# Patient Record
Sex: Female | Born: 1995
Health system: Southern US, Community
[De-identification: ages and names within clinical notes are randomized; demographics above are authoritative.]

## PROBLEM LIST (undated history)

## (undated) DIAGNOSIS — O10013 Pre-existing essential hypertension complicating pregnancy, third trimester: Secondary | ICD-10-CM

## (undated) HISTORY — PX: BACK SURGERY: SHX140

---
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
## 2016-02-23 DIAGNOSIS — Z111 Encounter for screening for respiratory tuberculosis: Secondary | ICD-10-CM | POA: Diagnosis not present

## 2016-04-21 DIAGNOSIS — Z23 Encounter for immunization: Secondary | ICD-10-CM | POA: Diagnosis not present

## 2016-04-21 DIAGNOSIS — R002 Palpitations: Secondary | ICD-10-CM | POA: Diagnosis not present

## 2016-04-21 DIAGNOSIS — Z6839 Body mass index (BMI) 39.0-39.9, adult: Secondary | ICD-10-CM | POA: Diagnosis not present

## 2016-05-02 DIAGNOSIS — R002 Palpitations: Secondary | ICD-10-CM | POA: Diagnosis not present

## 2016-06-21 DIAGNOSIS — Z111 Encounter for screening for respiratory tuberculosis: Secondary | ICD-10-CM | POA: Diagnosis not present

## 2016-08-11 DIAGNOSIS — Z3202 Encounter for pregnancy test, result negative: Secondary | ICD-10-CM | POA: Diagnosis not present

## 2016-08-11 DIAGNOSIS — Z30012 Encounter for prescription of emergency contraception: Secondary | ICD-10-CM | POA: Diagnosis not present

## 2016-08-24 DIAGNOSIS — Z3009 Encounter for other general counseling and advice on contraception: Secondary | ICD-10-CM | POA: Diagnosis not present

## 2016-09-28 DIAGNOSIS — D171 Benign lipomatous neoplasm of skin and subcutaneous tissue of trunk: Secondary | ICD-10-CM | POA: Diagnosis not present

## 2016-09-28 DIAGNOSIS — Z6841 Body Mass Index (BMI) 40.0 and over, adult: Secondary | ICD-10-CM | POA: Diagnosis not present

## 2016-10-11 DIAGNOSIS — R222 Localized swelling, mass and lump, trunk: Secondary | ICD-10-CM | POA: Diagnosis not present

## 2016-10-14 DIAGNOSIS — R222 Localized swelling, mass and lump, trunk: Secondary | ICD-10-CM | POA: Diagnosis not present

## 2016-10-14 DIAGNOSIS — F17210 Nicotine dependence, cigarettes, uncomplicated: Secondary | ICD-10-CM | POA: Diagnosis not present

## 2016-10-14 DIAGNOSIS — Z882 Allergy status to sulfonamides status: Secondary | ICD-10-CM | POA: Diagnosis not present

## 2016-10-14 DIAGNOSIS — D171 Benign lipomatous neoplasm of skin and subcutaneous tissue of trunk: Secondary | ICD-10-CM | POA: Diagnosis not present

## 2016-11-10 DIAGNOSIS — Z720 Tobacco use: Secondary | ICD-10-CM | POA: Diagnosis not present

## 2016-11-10 DIAGNOSIS — J029 Acute pharyngitis, unspecified: Secondary | ICD-10-CM | POA: Diagnosis not present

## 2016-11-10 DIAGNOSIS — Z6839 Body mass index (BMI) 39.0-39.9, adult: Secondary | ICD-10-CM | POA: Diagnosis not present

## 2016-11-10 MED FILL — AMOXICILLIN 875 MG TABLET: 875 | 10 days supply | Qty: 20 | Fill #0

## 2017-01-23 DIAGNOSIS — Z029 Encounter for administrative examinations, unspecified: Secondary | ICD-10-CM | POA: Diagnosis not present

## 2017-01-23 DIAGNOSIS — Z23 Encounter for immunization: Secondary | ICD-10-CM | POA: Diagnosis not present

## 2017-01-23 DIAGNOSIS — Z1159 Encounter for screening for other viral diseases: Secondary | ICD-10-CM | POA: Diagnosis not present

## 2017-01-26 DIAGNOSIS — Z23 Encounter for immunization: Secondary | ICD-10-CM | POA: Diagnosis not present

## 2017-04-10 DIAGNOSIS — Z3202 Encounter for pregnancy test, result negative: Secondary | ICD-10-CM | POA: Diagnosis not present

## 2017-04-12 DIAGNOSIS — R030 Elevated blood-pressure reading, without diagnosis of hypertension: Secondary | ICD-10-CM | POA: Diagnosis not present

## 2017-04-12 DIAGNOSIS — Z3201 Encounter for pregnancy test, result positive: Secondary | ICD-10-CM | POA: Diagnosis not present

## 2017-04-12 DIAGNOSIS — Z6838 Body mass index (BMI) 38.0-38.9, adult: Secondary | ICD-10-CM | POA: Diagnosis not present

## 2017-04-12 DIAGNOSIS — R002 Palpitations: Secondary | ICD-10-CM | POA: Diagnosis not present

## 2017-09-25 ENCOUNTER — Encounter: Payer: Self-pay | Admitting: Family Medicine

## 2017-09-25 ENCOUNTER — Ambulatory Visit (INDEPENDENT_AMBULATORY_CARE_PROVIDER_SITE_OTHER): Payer: No Typology Code available for payment source | Admitting: Family Medicine

## 2017-09-25 VITALS — BP 126/80 | HR 95 | Ht 65.0 in | Wt 223.1 lb

## 2017-09-25 DIAGNOSIS — Z6837 Body mass index (BMI) 37.0-37.9, adult: Secondary | ICD-10-CM | POA: Diagnosis not present

## 2017-09-25 DIAGNOSIS — F419 Anxiety disorder, unspecified: Secondary | ICD-10-CM | POA: Diagnosis not present

## 2017-09-25 DIAGNOSIS — E6609 Other obesity due to excess calories: Secondary | ICD-10-CM

## 2017-09-25 DIAGNOSIS — Z0000 Encounter for general adult medical examination without abnormal findings: Secondary | ICD-10-CM

## 2017-09-25 DIAGNOSIS — Z720 Tobacco use: Secondary | ICD-10-CM | POA: Diagnosis not present

## 2017-09-25 DIAGNOSIS — Z0001 Encounter for general adult medical examination with abnormal findings: Secondary | ICD-10-CM | POA: Diagnosis not present

## 2017-09-25 MED ORDER — MELATONIN 5 MG PO TABS: ORAL_TABLET | ORAL | 0 refills | Status: DC

## 2017-09-25 MED ORDER — FLUOXETINE HCL 10 MG PO TABS: 10.0000 mg | ORAL_TABLET | Freq: Every day | ORAL | 1 refills | Status: DC

## 2017-09-25 MED FILL — FLUoxetine HCL 10 MG TABS: 10 | 30 days supply | Qty: 30 | Fill #0

## 2017-09-25 NOTE — Progress Notes (Signed)
Subjective:  Patient ID: Lisa Sandoval, female    DOB: [birth year]  Age: 22 y.o. MRN: [id number]  CC: Establish Care   HPI [name] presents for a physical exam and to discuss her issues with anxiety and tobacco use.  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  She tells me that it is never been treated.  She is a chronic worrier and does not feel as though she is depressed.  She tells me that she is actually had to pull her car over before because of her anxiety.  She does have some trouble falling asleep at night.  She tells me that her anxiety has never been treated.  She has been smoking over the last 6 months and wants to quit.  She has tried the gum before but it was not helpful.  She is nonfasting this morning.  Her parents are in their mid 20s and both of them have diabetes.  Her mother is otherwise healthy as far as she knows.  Her father is another Research officer, trade [employer].  He developed heart disease in his early 51s and is status post 3 MIs.  He also was a smoker.  Patient lives alone.  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  She also works as a Public relations account executive.  She has engaged no other exercise but what she gets at work.  She has had a Pap smear at her GYN care is through her GYN physician.  History Skyla has no past medical history on file.   She has no past surgical history on file.   Her family history is not on file.She reports that she has never smoked. She has never used smokeless tobacco. She reports that she does not drink alcohol or use drugs.  Outpatient Medications Prior to Visit  Medication Sig Dispense Refill  . Ascorbic Acid (VITAMIN C PO) Take 1 tablet by mouth daily.    Marland Kitchen bismuth subsalicylate (PEPTO BISMOL) 262 MG/15ML suspension Take 30 mLs by mouth every 6 (six) hours as needed for indigestion.    Marland Kitchen ibuprofen (ADVIL,MOTRIN) 200 MG tablet Take 800 mg by mouth every 6 (six) hours as needed for headache.    . medroxyPROGESTERone (DEPO-PROVERA) 150 MG/ML  injection Inject 150 mg into the muscle every 3 (three) months.    . pseudoephedrine (SUDAFED) 60 MG tablet Take 60 mg by mouth every 8 (eight) hours as needed for congestion.     No facility-administered medications prior to visit.     ROS Review of Systems  Constitutional: Negative.   HENT: Negative.   Eyes: Negative.   Respiratory: Negative.   Cardiovascular: Negative.   Gastrointestinal: Negative.   Endocrine: Negative for polyphagia and polyuria.  Genitourinary: Negative.   Musculoskeletal: Negative.   Skin: Negative.   Allergic/Immunologic: Negative for immunocompromised state.  Neurological: Negative for weakness and light-headedness.  Hematological: Does not bruise/bleed easily.  Psychiatric/Behavioral: Positive for sleep disturbance. Negative for dysphoric mood, self-injury and suicidal ideas. The patient is nervous/anxious.     Objective:  BP 126/80 (BP Location: Right Arm, Patient Position: Sitting, Cuff Size: Normal)   Pulse 95   Ht 5\' 5"  (1.651 m)   Wt 223 lb 2 oz (101.2 kg)   SpO2 99%   BMI 37.13 kg/m   Physical Exam  Constitutional: She appears well-developed and well-nourished. No distress.  HENT:  Head: Normocephalic and atraumatic.  Right Ear: External ear normal.  Left Ear: External ear normal.  Nose: Nose  normal.  Mouth/Throat: Oropharynx is clear and moist.  Eyes: Pupils are equal, round, and reactive to light. Conjunctivae and EOM are normal. Right eye exhibits no discharge. Left eye exhibits no discharge. No scleral icterus.  Neck: Normal range of motion. Neck supple. No JVD present. No tracheal deviation present. No thyromegaly present.  Cardiovascular: Normal rate, regular rhythm and normal heart sounds.  Pulmonary/Chest: Effort normal and breath sounds normal.  Abdominal: Bowel sounds are normal.  Musculoskeletal: She exhibits no edema, tenderness or deformity.  Lymphadenopathy:    She has no cervical adenopathy.  Skin: Skin is warm and dry.  She is not diaphoretic.  Psychiatric: She has a normal mood and affect. Her behavior is normal.      Assessment & Plan:   Saharra was seen today for establish care.  Diagnoses and all orders for this visit:  Anxiety -     TSH; Future -     FLUoxetine (PROZAC) 10 MG tablet; Take 1 tablet (10 mg total) by mouth daily. -     Melatonin 5 MG TABS; Take one nightly before bed.  Tobacco abuse  Class 2 obesity due to excess calories without serious comorbidity with body mass index (BMI) of 37.0 to 37.9 in adult -     TSH; Future  Health care maintenance  Encounter for health maintenance examination with abnormal findings -     CBC; Future -     Comprehensive metabolic panel; Future -     HIV antibody; Future -     Lipid panel; Future -     TSH; Future -     Urinalysis, Routine w reflex microscopic; Future   I have discontinued Lilia Pro B. Venn's medroxyPROGESTERone, ibuprofen, pseudoephedrine, Ascorbic Acid (VITAMIN C PO), and bismuth subsalicylate. I am also having her start on FLUoxetine and Melatonin.  Meds ordered this encounter  Medications  . FLUoxetine (PROZAC) 10 MG tablet    Sig: Take 1 tablet (10 mg total) by mouth daily.    Dispense:  30 tablet    Refill:  1  . Melatonin 5 MG TABS    Sig: Take one nightly before bed.    Dispense:  90 tablet    Refill:  0   Anticipatory guidance was given the patient about smoking and its effect on her health and strategies to stop smoking as well.  Information was given to her about obesity and starting an exercise program to help her lose weight.  Suggested that the low-dose Prozac will also help her lose weight gain energy for her negative exercise program.  Stressed the importance of fasting blood work for her with regards to her father's health.  Recommended follow-up for her is in 1 month.  Follow-up: Return in about 1 month (around 10/25/2017).  Libby Maw, MD

## 2017-09-25 NOTE — Patient Instructions (Signed)
BMI for Adults Body mass index (BMI) is a number that is calculated from a person's weight and height. In most adults, the number is used to find how much of an adult's weight is made up of fat. BMI is not as accurate as a direct measure of body fat. How is BMI calculated? BMI is calculated by dividing weight in kilograms by height in meters squared. It can also be calculated by dividing weight in pounds by height in inches squared, then multiplying the resulting number by 703. Charts are available to help you find your BMI quickly and easily without doing this calculation. How is BMI interpreted? Health care professionals use BMI charts to identify whether an adult is underweight, at a normal weight, or overweight based on the following guidelines:  Underweight: BMI less than 18.5.  Normal weight: BMI between 18.5 and 24.9.  Overweight: BMI between 25 and 29.9.  Obese: BMI of 30 and above.  BMI is usually interpreted the same for males and females. Weight includes both fat and muscle, so someone with a muscular build, such as an athlete, may have a BMI that is higher than 24.9. In cases like these, BMI may not accurately depict body fat. To determine if excess body fat is the cause of a BMI of 25 or higher, further assessments may need to be done by a health care provider. Why is BMI a useful tool? BMI is used to identify a possible weight problem that may be related to a medical problem or may increase the risk for medical problems. BMI can also be used to promote changes to reach a healthy weight. This information is not intended to replace advice given to you by your health care provider. Make sure you discuss any questions you have with your health care provider. Document Released: 02/02/2004 Document Revised: 10/01/2015 Document Reviewed: 10/18/2013 Elsevier Interactive Patient Education  2018 [employer].  Generalized Anxiety Disorder, Adult Generalized anxiety disorder (GAD) is a  mental health disorder. People with this condition constantly worry about everyday events. Unlike normal anxiety, worry related to GAD is not triggered by a specific event. These worries also do not fade or get better with time. GAD interferes with life functions, including relationships, work, and school. GAD can vary from mild to severe. People with severe GAD can have intense waves of anxiety with physical symptoms (panic attacks). What are the causes? The exact cause of GAD is not known. What increases the risk? This condition is more likely to develop in:  Women.  People who have a family history of anxiety disorders.  People who are very shy.  People who experience very stressful life events, such as the death of a loved one.  People who have a very stressful family environment.  What are the signs or symptoms? People with GAD often worry excessively about many things in their lives, such as their health and family. They may also be overly concerned about:  Doing well at work.  Being on time.  Natural disasters.  Friendships.  Physical symptoms of GAD include:  Fatigue.  Muscle tension or having muscle twitches.  Trembling or feeling shaky.  Being easily startled.  Feeling like your heart is pounding or racing.  Feeling out of breath or like you cannot take a deep breath.  Having trouble falling asleep or staying asleep.  Sweating.  Nausea, diarrhea, or irritable bowel syndrome (IBS).  Headaches.  Trouble concentrating or remembering facts.  Restlessness.  Irritability.  How is  this diagnosed? Your health care provider can diagnose GAD based on your symptoms and medical history. You will also have a physical exam. The health care provider will ask specific questions about your symptoms, including how severe they are, when they started, and if they come and go. Your health care provider may ask you about your use of alcohol or drugs, including  prescription medicines. Your health care provider may refer you to a mental health specialist for further evaluation. Your health care provider will do a thorough examination and may perform additional tests to rule out other possible causes of your symptoms. To be diagnosed with GAD, a person must have anxiety that:  Is out of his or her control.  Affects several different aspects of his or her life, such as work and relationships.  Causes distress that makes him or her unable to take part in normal activities.  Includes at least three physical symptoms of GAD, such as restlessness, fatigue, trouble concentrating, irritability, muscle tension, or sleep problems.  Before your health care provider can confirm a diagnosis of GAD, these symptoms must be present more days than they are not, and they must last for six months or longer. How is this treated? The following therapies are usually used to treat GAD:  Medicine. Antidepressant medicine is usually prescribed for long-term daily control. Antianxiety medicines may be added in severe cases, especially when panic attacks occur.  Talk therapy (psychotherapy). Certain types of talk therapy can be helpful in treating GAD by providing support, education, and guidance. Options include: ? Cognitive behavioral therapy (CBT). People learn coping skills and techniques to ease their anxiety. They learn to identify unrealistic or negative thoughts and behaviors and to replace them with positive ones. ? Acceptance and commitment therapy (ACT). This treatment teaches people how to be mindful as a way to cope with unwanted thoughts and feelings. ? Biofeedback. This process trains you to manage your body's response (physiological response) through breathing techniques and relaxation methods. You will work with a therapist while machines are used to monitor your physical symptoms.  Stress management techniques. These include yoga, meditation, and  exercise.  A mental health specialist can help determine which treatment is best for you. Some people see improvement with one type of therapy. However, other people require a combination of therapies. Follow these instructions at home:  Take over-the-counter and prescription medicines only as told by your health care provider.  Try to maintain a normal routine.  Try to anticipate stressful situations and allow extra time to manage them.  Practice any stress management or self-calming techniques as taught by your health care provider.  Do not punish yourself for setbacks or for not making progress.  Try to recognize your accomplishments, even if they are small.  Keep all follow-up visits as told by your health care provider. This is important. Contact a health care provider if:  Your symptoms do not get better.  Your symptoms get worse.  You have signs of depression, such as: ? A persistently sad, cranky, or irritable mood. ? Loss of enjoyment in activities that used to bring you joy. ? Change in weight or eating. ? Changes in sleeping habits. ? Avoiding friends or family members. ? Loss of energy for normal tasks. ? Feelings of guilt or worthlessness. Get help right away if:  You have serious thoughts about hurting yourself or others. If you ever feel like you may hurt yourself or others, or have thoughts about taking your own life,  get help right away. You can go to your nearest emergency department or call:  Your local emergency services (911 in the [place]  A suicide crisis helpline, such as the [facility] at 773-010-1312. This is open 24 hours a day.  Summary  Generalized anxiety disorder (GAD) is a mental health disorder that involves worry that is not triggered by a specific event.  People with GAD often worry excessively about many things in their lives, such as their health and family.  GAD may cause physical symptoms such as  restlessness, trouble concentrating, sleep problems, frequent sweating, nausea, diarrhea, headaches, and trembling or muscle twitching.  A mental health specialist can help determine which treatment is best for you. Some people see improvement with one type of therapy. However, other people require a combination of therapies. This information is not intended to replace advice given to you by your health care provider. Make sure you discuss any questions you have with your health care provider. Document Released: 09/17/2012 Document Revised: 04/12/2016 Document Reviewed: 04/12/2016 Elsevier Interactive Patient Education  2018 [place] Maintenance, Female Adopting a healthy lifestyle and getting preventive care can go a long way to promote health and wellness. Talk with your health care provider about what schedule of regular examinations is right for you. This is a good chance for you to check in with your provider about disease prevention and staying healthy. In between checkups, there are plenty of things you can do on your own. Experts have done a lot of research about which lifestyle changes and preventive measures are most likely to keep you healthy. Ask your health care provider for more information. Weight and diet Eat a healthy diet  Be sure to include plenty of vegetables, fruits, low-fat dairy products, and lean protein.  Do not eat a lot of foods high in solid fats, added sugars, or salt.  Get regular exercise. This is one of the most important things you can do for your health. ? Most adults should exercise for at least 150 minutes each week. The exercise should increase your heart rate and make you sweat (moderate-intensity exercise). ? Most adults should also do strengthening exercises at least twice a week. This is in addition to the moderate-intensity exercise.  Maintain a healthy weight  Body mass index (BMI) is a measurement that can be used to identify possible  weight problems. It estimates body fat based on height and weight. Your health care provider can help determine your BMI and help you achieve or maintain a healthy weight.  For females 12 years of age and older: ? A BMI below 18.5 is considered underweight. ? A BMI of 18.5 to 24.9 is normal. ? A BMI of 25 to 29.9 is considered overweight. ? A BMI of 30 and above is considered obese.  Watch levels of cholesterol and blood lipids  You should start having your blood tested for lipids and cholesterol at 21 years of age, then have this test every 5 years.  You may need to have your cholesterol levels checked more often if: ? Your lipid or cholesterol levels are high. ? You are older than 21 years of age. ? You are at high risk for heart disease.  Cancer screening Lung Cancer  Lung cancer screening is recommended for adults 59-70 years old who are at high risk for lung cancer because of a history of smoking.  A yearly low-dose CT scan of the lungs is recommended for people who: ?  Currently smoke. ? Have quit within the past 15 years. ? Have at least a 30-pack-year history of smoking. A pack year is smoking an average of one pack of cigarettes a day for 1 year.  Yearly screening should continue until it has been 15 years since you quit.  Yearly screening should stop if you develop a health problem that would prevent you from having lung cancer treatment.  Breast Cancer  Practice breast self-awareness. This means understanding how your breasts normally appear and feel.  It also means doing regular breast self-exams. Let your health care provider know about any changes, no matter how small.  If you are in your 20s or 30s, you should have a clinical breast exam (CBE) by a health care provider every 1-3 years as part of a regular health exam.  If you are 95 or older, have a CBE every year. Also consider having a breast X-ray (mammogram) every year.  If you have a family history of  breast cancer, talk to your health care provider about genetic screening.  If you are at high risk for breast cancer, talk to your health care provider about having an MRI and a mammogram every year.  Breast cancer gene (BRCA) assessment is recommended for women who have family members with BRCA-related cancers. BRCA-related cancers include: ? Breast. ? Ovarian. ? Tubal. ? Peritoneal cancers.  Results of the assessment will determine the need for genetic counseling and BRCA1 and BRCA2 testing.  Cervical Cancer Your health care provider may recommend that you be screened regularly for cancer of the pelvic organs (ovaries, uterus, and vagina). This screening involves a pelvic examination, including checking for microscopic changes to the surface of your cervix (Pap test). You may be encouraged to have this screening done every 3 years, beginning at age 59.  For women ages 74-65, health care providers may recommend pelvic exams and Pap testing every 3 years, or they may recommend the Pap and pelvic exam, combined with testing for human papilloma virus (HPV), every 5 years. Some types of HPV increase your risk of cervical cancer. Testing for HPV may also be done on women of any age with unclear Pap test results.  Other health care providers may not recommend any screening for nonpregnant women who are considered low risk for pelvic cancer and who do not have symptoms. Ask your health care provider if a screening pelvic exam is right for you.  If you have had past treatment for cervical cancer or a condition that could lead to cancer, you need Pap tests and screening for cancer for at least 20 years after your treatment. If Pap tests have been discontinued, your risk factors (such as having a new sexual partner) need to be reassessed to determine if screening should resume. Some women have medical problems that increase the chance of getting cervical cancer. In these cases, your health care provider  may recommend more frequent screening and Pap tests.  Colorectal Cancer  This type of cancer can be detected and often prevented.  Routine colorectal cancer screening usually begins at 21 years of age and continues through 21 years of age.  Your health care provider may recommend screening at an earlier age if you have risk factors for colon cancer.  Your health care provider may also recommend using home test kits to check for hidden blood in the stool.  A small camera at the end of a tube can be used to examine your colon directly (sigmoidoscopy or colonoscopy).  This is done to check for the earliest forms of colorectal cancer.  Routine screening usually begins at age 34.  Direct examination of the colon should be repeated every 5-10 years through 21 years of age. However, you may need to be screened more often if early forms of precancerous polyps or small growths are found.  Skin Cancer  Check your skin from head to toe regularly.  Tell your health care provider about any new moles or changes in moles, especially if there is a change in a mole's shape or color.  Also tell your health care provider if you have a mole that is larger than the size of a pencil eraser.  Always use sunscreen. Apply sunscreen liberally and repeatedly throughout the day.  Protect yourself by wearing long sleeves, pants, a wide-brimmed hat, and sunglasses whenever you are outside.  Heart disease, diabetes, and high blood pressure  High blood pressure causes heart disease and increases the risk of stroke. High blood pressure is more likely to develop in: ? People who have blood pressure in the high end of the normal range (130-139/85-89 mm Hg). ? People who are overweight or obese. ? People who are African American.  If you are 75-12 years of age, have your blood pressure checked every 3-5 years. If you are 58 years of age or older, have your blood pressure checked every year. You should have your  blood pressure measured twice-once when you are at a hospital or clinic, and once when you are not at a hospital or clinic. Record the average of the two measurements. To check your blood pressure when you are not at a hospital or clinic, you can use: ? An automated blood pressure machine at a pharmacy. ? A home blood pressure monitor.  If you are between 46 years and 59 years old, ask your health care provider if you should take aspirin to prevent strokes.  Have regular diabetes screenings. This involves taking a blood sample to check your fasting blood sugar level. ? If you are at a normal weight and have a low risk for diabetes, have this test once every three years after 21 years of age. ? If you are overweight and have a high risk for diabetes, consider being tested at a younger age or more often. Preventing infection Hepatitis B  If you have a higher risk for hepatitis B, you should be screened for this virus. You are considered at high risk for hepatitis B if: ? You were born in a country where hepatitis B is common. Ask your health care provider which countries are considered high risk. ? Your parents were born in a high-risk country, and you have not been immunized against hepatitis B (hepatitis B vaccine). ? You have HIV or AIDS. ? You use needles to inject street drugs. ? You live with someone who has hepatitis B. ? You have had sex with someone who has hepatitis B. ? You get hemodialysis treatment. ? You take certain medicines for conditions, including cancer, organ transplantation, and autoimmune conditions.  Hepatitis C  Blood testing is recommended for: ? Everyone born from [birth year] through 1964. ? Anyone with known risk factors for hepatitis C.  Sexually transmitted infections (STIs)  You should be screened for sexually transmitted infections (STIs) including gonorrhea and chlamydia if: ? You are sexually active and are younger than 21 years of age. ? You are older than 21  years of age and your health care provider tells you that  you are at risk for this type of infection. ? Your sexual activity has changed since you were last screened and you are at an increased risk for chlamydia or gonorrhea. Ask your health care provider if you are at risk.  If you do not have HIV, but are at risk, it may be recommended that you take a prescription medicine daily to prevent HIV infection. This is called pre-exposure prophylaxis (PrEP). You are considered at risk if: ? You are sexually active and do not regularly use condoms or know the HIV status of your partner(s). ? You take drugs by injection. ? You are sexually active with a partner who has HIV.  Talk with your health care provider about whether you are at high risk of being infected with HIV. If you choose to begin PrEP, you should first be tested for HIV. You should then be tested every 3 months for as long as you are taking PrEP. Pregnancy  If you are premenopausal and you may become pregnant, ask your health care provider about preconception counseling.  If you may become pregnant, take 400 to 800 micrograms (mcg) of folic acid every day.  If you want to prevent pregnancy, talk to your health care provider about birth control (contraception). Osteoporosis and menopause  Osteoporosis is a disease in which the bones lose minerals and strength with aging. This can result in serious bone fractures. Your risk for osteoporosis can be identified using a bone density scan.  If you are 44 years of age or older, or if you are at risk for osteoporosis and fractures, ask your health care provider if you should be screened.  Ask your health care provider whether you should take a calcium or vitamin D supplement to lower your risk for osteoporosis.  Menopause may have certain physical symptoms and risks.  Hormone replacement therapy may reduce some of these symptoms and risks. Talk to your health care provider about whether  hormone replacement therapy is right for you. Follow these instructions at home:  Schedule regular health, dental, and eye exams.  Stay current with your immunizations.  Do not use any tobacco products including cigarettes, chewing tobacco, or electronic cigarettes.  If you are pregnant, do not drink alcohol.  If you are breastfeeding, limit how much and how often you drink alcohol.  Limit alcohol intake to no more than 1 drink per day for nonpregnant women. One drink equals 12 ounces of beer, 5 ounces of wine, or 1 ounces of hard liquor.  Do not use street drugs.  Do not share needles.  Ask your health care provider for help if you need support or information about quitting drugs.  Tell your health care provider if you often feel depressed.  Tell your health care provider if you have ever been abused or do not feel safe at home. This information is not intended to replace advice given to you by your health care provider. Make sure you discuss any questions you have with your health care provider. Document Released: 12/06/2010 Document Revised: 10/29/2015 Document Reviewed: 02/24/2015 Elsevier Interactive Patient Education  2018 [place] Risks of Smoking Smoking cigarettes is very bad for your health. Tobacco smoke has over 200 known poisons in it. It contains the poisonous gases nitrogen oxide and carbon monoxide. There are over 60 chemicals in tobacco smoke that cause cancer. Smoking is difficult to quit because a chemical in tobacco, called nicotine, causes addiction or dependence. When you smoke and inhale, nicotine is  absorbed rapidly into the bloodstream through your lungs. Both inhaled and non-inhaled nicotine may be addictive. What are the risks of cigarette smoke? Cigarette smokers have an increased risk of many serious medical problems, including:  Lung cancer.  Lung disease, such as pneumonia, bronchitis, and emphysema.  Chest pain (angina) and heart  attack because the heart is not getting enough oxygen.  Heart disease and peripheral blood vessel disease.  High blood pressure (hypertension).  Stroke.  Oral cancer, including cancer of the lip, mouth, or voice box.  Bladder cancer.  Pancreatic cancer.  Cervical cancer.  Pregnancy complications, including premature birth.  Stillbirths and smaller newborn babies, birth defects, and genetic damage to sperm.  Early menopause.  Lower estrogen level for women.  Infertility.  Facial wrinkles.  Blindness.  Increased risk of broken bones (fractures).  Senile dementia.  Stomach ulcers and internal bleeding.  Delayed wound healing and increased risk of complications during surgery.  Even smoking lightly shortens your life expectancy by several years.  Because of secondhand smoke exposure, children of smokers have an increased risk of the following:  Sudden infant death syndrome (SIDS).  Respiratory infections.  Lung cancer.  Heart disease.  Ear infections.  What are the benefits of quitting? There are many health benefits of quitting smoking. Here are some of them:  Within days of quitting smoking, your risk of having a heart attack decreases, your blood flow improves, and your lung capacity improves. Blood pressure, pulse rate, and breathing patterns start returning to normal soon after quitting.  Within months, your lungs may clear up completely.  Quitting for 10 years reduces your risk of developing lung cancer and heart disease to almost that of a nonsmoker.  People who quit may see an improvement in their overall quality of life.  How do I quit smoking? Smoking is an addiction with both physical and psychological effects, and longtime habits can be hard to change. Your health care provider can recommend:  Programs and community resources, which may include group support, education, or talk therapy.  Prescription medicines to help reduce  cravings.  Nicotine replacement products, such as patches, gum, and nasal sprays. Use these products only as directed. Do not replace cigarette smoking with electronic cigarettes, which are commonly called e-cigarettes. The safety of e-cigarettes is not known, and some may contain harmful chemicals.  A combination of two or more of these methods.  Where to find more information:  American Lung Association: www.lung.org  American Cancer Society: www.cancer.org Summary  Smoking cigarettes is very bad for your health. Cigarette smokers have an increased risk of many serious medical problems, including several cancers, heart disease, and stroke.  Smoking is an addiction with both physical and psychological effects, and longtime habits can be hard to change.  By stopping right away, you can greatly reduce the risk of medical problems for you and your family.  To help you quit smoking, your health care provider can recommend programs, community resources, prescription medicines, and nicotine replacement products such as patches, gum, and nasal sprays. This information is not intended to replace advice given to you by your health care provider. Make sure you discuss any questions you have with your health care provider. Document Released: 06/30/2004 Document Revised: 05/27/2016 Document Reviewed: 05/27/2016 Elsevier Interactive Patient Education  2017 [place].  How to Increase Your Level of Physical Activity Getting regular physical activity is important for your overall health and well-being. Most people do not get enough exercise. There are easy ways  to increase your level of physical activity, even if you have not been very active in the past or you are just starting out. Why is physical activity important? Physical activity has many short-term and long-term health benefits. Regular exercise can:  Help you lose weight or maintain a healthy weight.  Strengthen your muscles and  bones.  Boost your mood and improve self-esteem.  Reduce your risk of certain long-term (chronic) diseases, like heart disease, cancer, and diabetes.  Help you stay capable of walking and moving around (mobile) as you age.  Prevent accidents, such as falls, as you age.  Increase life expectancy.  What are the benefits of being physically active on a regular basis? In addition to improving your physical health, being physically active on most days of the week can help you in ways that you may not expect. Benefits of regular physical activity may include:  Feeling good about your body.  Being able to move around more easily and for longer periods of time without getting tired (increased stamina).  Finding new sources of fun and enjoyment.  Meeting new people who share a common interest.  Being able to fight off illness better (enhanced immunity).  Being able to sleep better.  What can happen if I am not physically active on a regular basis? Not getting enough physical activity can lead to an unhealthy lifestyle and future health problems. This can increase your chances of:  Becoming overweight or obese.  Becoming sick.  Developing chronic illnesses, like heart disease or diabetes.  Having mental health problems, like depression or anxiety.  Having sleep problems.  Having trouble walking or getting yourself around (reduced mobility).  Injuring yourself in a fall as you get older.  What steps can I take to be more physically active?  Check with your health care provider about how to get started. Ask your health care provider what activities are safe for you.  Start out slowly. Walking or doing some simple chair exercises is a good place to start, especially if you have not been active before or for a long time.  Try to find activities that you enjoy. You are more likely to commit to an exercise routine if it does not feel like a chore.  If you have bone or joint  problems, choose low-impact exercises, like walking or swimming.  Include physical activity in your everyday routine.  Invite friends or family members to exercise with you. This also will help you commit to your workout plan.  Set goals that you can work toward.  Aim for at least 150 minutes of moderate-intensity exercise each week. Examples of moderate-intensity exercise include walking or riding a bike. Where to find more information:  Centers for Disease Control and Prevention: [employer]  President's [employer] on [employer], Sports & Nutrition www.http://villegas.org/  ChooseMyPlate: [employer] Contact a health care provider if:  You have headaches, muscle aches, or joint pain.  You feel dizzy or light-headed while exercising.  You faint.  You have chest pain while exercising. Summary  Exercise benefits your mind and body at any age, even if you are just starting out.  If you have a chronic illness or have not been active for a while, check with your health care provider before increasing your physical activity.  Choose activities that are safe and enjoyable for you.Ask your health care provider what activities are safe for you.  Start slowly. Tell your health care provider if you have problems as you start to increase your  activity level. This information is not intended to replace advice given to you by your health care provider. Make sure you discuss any questions you have with your health care provider. Document Released: 05/12/2016 Document Revised: 05/12/2016 Document Reviewed: 05/12/2016 Elsevier Interactive Patient Education  2018 [employer].  Obesity, Adult Obesity is the condition of having too much total body fat. Being overweight or obese means that your weight is greater than what is considered healthy for your body size. Obesity is determined by a measurement called BMI. BMI is an estimate of  body fat and is calculated from height and weight. For adults, a BMI of 30 or higher is considered obese. Obesity can eventually lead to other health concerns and major illnesses, including:  Stroke.  Coronary artery disease (CAD).  Type 2 diabetes.  Some types of cancer, including cancers of the colon, breast, uterus, and gallbladder.  Osteoarthritis.  High blood pressure (hypertension).  High cholesterol.  Sleep apnea.  Gallbladder stones.  Infertility problems.  What are the causes? The main cause of obesity is taking in (consuming) more calories than your body uses for energy. Other factors that contribute to this condition may include:  Being born with genes that make you more likely to become obese.  Having a medical condition that causes obesity. These conditions include: ? Hypothyroidism. ? Polycystic ovarian syndrome (PCOS). ? Binge-eating disorder. ? Cushing syndrome.  Taking certain medicines, such as steroids, antidepressants, and seizure medicines.  Not being physically active (sedentary lifestyle).  Living where there are limited places to exercise safely or buy healthy foods.  Not getting enough sleep.  What increases the risk? The following factors may increase your risk of this condition:  Having a family history of obesity.  Being a woman of African-American descent.  Being a man of Hispanic descent.  What are the signs or symptoms? Having excessive body fat is the main symptom of this condition. How is this diagnosed? This condition may be diagnosed based on:  Your symptoms.  Your medical history.  A physical exam. Your health care provider may measure: ? Your BMI. If you are an adult with a BMI between 25 and less than 30, you are considered overweight. If you are an adult with a BMI of 30 or higher, you are considered obese. ? The distances around your hips and your waist (circumferences). These may be compared to each other to help  diagnose your condition. ? Your skinfold thickness. Your health care provider may gently pinch a fold of your skin and measure it.  How is this treated? Treatment for this condition often includes changing your lifestyle. Treatment may include some or all of the following:  Dietary changes. Work with your health care provider and a dietitian to set a weight-loss goal that is healthy and reasonable for you. Dietary changes may include eating: ? Smaller portions. A portion size is the amount of a particular food that is healthy for you to eat at one time. This varies from person to person. ? Low-calorie or low-fat options. ? More whole grains, fruits, and vegetables.  Regular physical activity. This may include aerobic activity (cardio) and strength training.  Medicine to help you lose weight. Your health care provider may prescribe medicine if you are unable to lose 1 pound a week after 6 weeks of eating more healthily and doing more physical activity.  Surgery. Surgical options may include gastric banding and gastric bypass. Surgery may be done if: ? Other treatments have not  helped to improve your condition. ? You have a BMI of 40 or higher. ? You have life-threatening health problems related to obesity.  Follow these instructions at home:  Eating and drinking   Follow recommendations from your health care provider about what you eat and drink. Your health care provider may advise you to: ? Limit fast foods, sweets, and processed snack foods. ? Choose low-fat options, such as low-fat milk instead of whole milk. ? Eat 5 or more servings of fruits or vegetables every day. ? Eat at home more often. This gives you more control over what you eat. ? Choose healthy foods when you eat out. ? Learn what a healthy portion size is. ? Keep low-fat snacks on hand. ? Avoid sugary drinks, such as soda, fruit juice, iced tea sweetened with sugar, and flavored milk. ? Eat a healthy  breakfast.  Drink enough water to keep your urine clear or pale yellow.  Do not go without eating for long periods of time (do not fast) or follow a fad diet. Fasting and fad diets can be unhealthy and even dangerous. Physical Activity  Exercise regularly, as told by your health care provider. Ask your health care provider what types of exercise are safe for you and how often you should exercise.  Warm up and stretch before being active.  Cool down and stretch after being active.  Rest between periods of activity. Lifestyle  Limit the time that you spend in front of your TV, computer, or video game system.  Find ways to reward yourself that do not involve food.  Limit alcohol intake to no more than 1 drink a day for nonpregnant women and 2 drinks a day for men. One drink equals 12 oz of beer, 5 oz of wine, or 1 oz of hard liquor. General instructions  Keep a weight loss journal to keep track of the food you eat and how much you exercise you get.  Take over-the-counter and prescription medicines only as told by your health care provider.  Take vitamins and supplements only as told by your health care provider.  Consider joining a support group. Your health care provider may be able to recommend a support group.  Keep all follow-up visits as told by your health care provider. This is important. Contact a health care provider if:  You are unable to meet your weight loss goal after 6 weeks of dietary and lifestyle changes. This information is not intended to replace advice given to you by your health care provider. Make sure you discuss any questions you have with your health care provider. Document Released: 06/30/2004 Document Revised: 10/26/2015 Document Reviewed: 03/11/2015 Elsevier Interactive Patient Education  2018 [place] 18-39 Years, Female Preventive care refers to lifestyle choices and visits with your health care provider that can promote  health and wellness. What does preventive care include?  A yearly physical exam. This is also called an annual well check.  Dental exams once or twice a year.  Routine eye exams. Ask your health care provider how often you should have your eyes checked.  Personal lifestyle choices, including: ? Daily care of your teeth and gums. ? Regular physical activity. ? Eating a healthy diet. ? Avoiding tobacco and drug use. ? Limiting alcohol use. ? Practicing safe sex. ? Taking vitamin and mineral supplements as recommended by your health care provider. What happens during an annual well check? The services and screenings done by your health care provider during  your annual well check will depend on your age, overall health, lifestyle risk factors, and family history of disease. Counseling Your health care provider may ask you questions about your:  Alcohol use.  Tobacco use.  Drug use.  Emotional well-being.  Home and relationship well-being.  Sexual activity.  Eating habits.  Work and work Statistician.  Method of birth control.  Menstrual cycle.  Pregnancy history.  Screening You may have the following tests or measurements:  Height, weight, and BMI.  Diabetes screening. This is done by checking your blood sugar (glucose) after you have not eaten for a while (fasting).  Blood pressure.  Lipid and cholesterol levels. These may be checked every 5 years starting at age 11.  Skin check.  Hepatitis C blood test.  Hepatitis B blood test.  Sexually transmitted disease (STD) testing.  BRCA-related cancer screening. This may be done if you have a family history of breast, ovarian, tubal, or peritoneal cancers.  Pelvic exam and Pap test. This may be done every 3 years starting at age 93. Starting at age 20, this may be done every 5 years if you have a Pap test in combination with an HPV test.  Discuss your test results, treatment options, and if necessary, the need  for more tests with your health care provider. Vaccines Your health care provider may recommend certain vaccines, such as:  Influenza vaccine. This is recommended every year.  Tetanus, diphtheria, and acellular pertussis (Tdap, Td) vaccine. You may need a Td booster every 10 years.  Varicella vaccine. You may need this if you have not been vaccinated.  HPV vaccine. If you are 35 or younger, you may need three doses over 6 months.  Measles, mumps, and rubella (MMR) vaccine. You may need at least one dose of MMR. You may also need a second dose.  Pneumococcal 13-valent conjugate (PCV13) vaccine. You may need this if you have certain conditions and were not previously vaccinated.  Pneumococcal polysaccharide (PPSV23) vaccine. You may need one or two doses if you smoke cigarettes or if you have certain conditions.  Meningococcal vaccine. One dose is recommended if you are age 83-21 years and a first-year college student living in a residence hall, or if you have one of several medical conditions. You may also need additional booster doses.  Hepatitis A vaccine. You may need this if you have certain conditions or if you travel or work in places where you may be exposed to hepatitis A.  Hepatitis B vaccine. You may need this if you have certain conditions or if you travel or work in places where you may be exposed to hepatitis B.  Haemophilus influenzae type b (Hib) vaccine. You may need this if you have certain risk factors.  Talk to your health care provider about which screenings and vaccines you need and how often you need them. This information is not intended to replace advice given to you by your health care provider. Make sure you discuss any questions you have with your health care provider. Document Released: 07/19/2001 Document Revised: 02/10/2016 Document Reviewed: 03/24/2015 Elsevier Interactive Patient Education  2018 [employer].  Steps to Quit Smoking Smoking tobacco can  be harmful to your health and can affect almost every organ in your body. Smoking puts you, and those around you, at risk for developing many serious chronic diseases. Quitting smoking is difficult, but it is one of the best things that you can do for your health. It is never too late to  quit. What are the benefits of quitting smoking? When you quit smoking, you lower your risk of developing serious diseases and conditions, such as:  Lung cancer or lung disease, such as COPD.  Heart disease.  Stroke.  Heart attack.  Infertility.  Osteoporosis and bone fractures.  Additionally, symptoms such as coughing, wheezing, and shortness of breath may get better when you quit. You may also find that you get sick less often because your body is stronger at fighting off colds and infections. If you are pregnant, quitting smoking can help to reduce your chances of having a baby of low birth weight. How do I get ready to quit? When you decide to quit smoking, create a plan to make sure that you are successful. Before you quit:  Pick a date to quit. Set a date within the next two weeks to give you time to prepare.  Write down the reasons why you are quitting. Keep this list in places where you will see it often, such as on your bathroom mirror or in your car or wallet.  Identify the people, places, things, and activities that make you want to smoke (triggers) and avoid them. Make sure to take these actions: ? Throw away all cigarettes at home, at work, and in your car. ? Throw away smoking accessories, such as Scientist, research (medical). ? Clean your car and make sure to empty the ashtray. ? Clean your home, including curtains and carpets.  Tell your family, friends, and coworkers that you are quitting. Support from your loved ones can make quitting easier.  Talk with your health care provider about your options for quitting smoking.  Find out what treatment options are covered by your health  insurance.  What strategies can I use to quit smoking? Talk with your healthcare provider about different strategies to quit smoking. Some strategies include:  Quitting smoking altogether instead of gradually lessening how much you smoke over a period of time. Research shows that quitting "cold [place]" is more successful than gradually quitting.  Attending in-person counseling to help you build problem-solving skills. You are more likely to have success in quitting if you attend several counseling sessions. Even short sessions of 10 minutes can be effective.  Finding resources and support systems that can help you to quit smoking and remain smoke-free after you quit. These resources are most helpful when you use them often. They can include: ? Online chats with a Social worker. ? Telephone quitlines. ? Careers information officer. ? Support groups or group counseling. ? Text messaging programs. ? Mobile phone applications.  Taking medicines to help you quit smoking. (If you are pregnant or breastfeeding, talk with your health care provider first.) Some medicines contain nicotine and some do not. Both types of medicines help with cravings, but the medicines that include nicotine help to relieve withdrawal symptoms. Your health care provider may recommend: ? Nicotine patches, gum, or lozenges. ? Nicotine inhalers or sprays. ? Non-nicotine medicine that is taken by mouth.  Talk with your health care provider about combining strategies, such as taking medicines while you are also receiving in-person counseling. Using these two strategies together makes you more likely to succeed in quitting than if you used either strategy on its own. If you are pregnant or breastfeeding, talk with your health care provider about finding counseling or other support strategies to quit smoking. Do not take medicine to help you quit smoking unless told to do so by your health care provider. What things can  I do to make  it easier to quit? Quitting smoking might feel overwhelming at first, but there is a lot that you can do to make it easier. Take these important actions:  Reach out to your family and friends and ask that they support and encourage you during this time. Call telephone quitlines, reach out to support groups, or work with a counselor for support.  Ask people who smoke to avoid smoking around you.  Avoid places that trigger you to smoke, such as bars, parties, or smoke-break areas at work.  Spend time around people who do not smoke.  Lessen stress in your life, because stress can be a smoking trigger for some people. To lessen stress, try: ? Exercising regularly. ? Deep-breathing exercises. ? Yoga. ? Meditating. ? Performing a body scan. This involves closing your eyes, scanning your body from head to toe, and noticing which parts of your body are particularly tense. Purposefully relax the muscles in those areas.  Download or purchase mobile phone or tablet apps (applications) that can help you stick to your quit plan by providing reminders, tips, and encouragement. There are many free apps, such as QuitGuide from the [employer] Office manager for Disease Control and Prevention). You can find other support for quitting smoking (smoking cessation) through smokefree.gov and other websites.  How will I feel when I quit smoking? Within the first 24 hours of quitting smoking, you may start to feel some withdrawal symptoms. These symptoms are usually most noticeable 2-3 days after quitting, but they usually do not last beyond 2-3 weeks. Changes or symptoms that you might experience include:  Mood swings.  Restlessness, anxiety, or irritation.  Difficulty concentrating.  Dizziness.  Strong cravings for sugary foods in addition to nicotine.  Mild weight gain.  Constipation.  Nausea.  Coughing or a sore throat.  Changes in how your medicines work in your body.  A depressed mood.  Difficulty  sleeping (insomnia).  After the first 2-3 weeks of quitting, you may start to notice more positive results, such as:  Improved sense of smell and taste.  Decreased coughing and sore throat.  Slower heart rate.  Lower blood pressure.  Clearer skin.  The ability to breathe more easily.  Fewer sick days.  Quitting smoking is very challenging for most people. Do not get discouraged if you are not successful the first time. Some people need to make many attempts to quit before they achieve long-term success. Do your best to stick to your quit plan, and talk with your health care provider if you have any questions or concerns. This information is not intended to replace advice given to you by your health care provider. Make sure you discuss any questions you have with your health care provider. Document Released: 05/17/2001 Document Revised: 01/19/2016 Document Reviewed: 10/07/2014 Elsevier Interactive Patient Education  [employer].

## 2017-10-20 ENCOUNTER — Other Ambulatory Visit (INDEPENDENT_AMBULATORY_CARE_PROVIDER_SITE_OTHER): Payer: No Typology Code available for payment source

## 2017-10-20 DIAGNOSIS — E6609 Other obesity due to excess calories: Secondary | ICD-10-CM | POA: Diagnosis not present

## 2017-10-20 DIAGNOSIS — Z0001 Encounter for general adult medical examination with abnormal findings: Secondary | ICD-10-CM | POA: Diagnosis not present

## 2017-10-20 DIAGNOSIS — F419 Anxiety disorder, unspecified: Secondary | ICD-10-CM | POA: Diagnosis not present

## 2017-10-20 DIAGNOSIS — Z0000 Encounter for general adult medical examination without abnormal findings: Secondary | ICD-10-CM

## 2017-10-20 DIAGNOSIS — Z6837 Body mass index (BMI) 37.0-37.9, adult: Secondary | ICD-10-CM

## 2017-10-20 LAB — URINALYSIS, ROUTINE W REFLEX MICROSCOPIC
BILIRUBIN URINE: NEGATIVE
KETONES UR: NEGATIVE
LEUKOCYTES UA: NEGATIVE
NITRITE: NEGATIVE
PH: 6 (ref 5.0–8.0)
Specific Gravity, Urine: 1.02 (ref 1.000–1.030)
Total Protein, Urine: NEGATIVE
Urine Glucose: NEGATIVE
Urobilinogen, UA: 0.2 (ref 0.0–1.0)

## 2017-10-20 LAB — COMPREHENSIVE METABOLIC PANEL
ALBUMIN: 4.1 g/dL (ref 3.5–5.2)
ALT: 14 U/L (ref 0–35)
AST: 12 U/L (ref 0–37)
Alkaline Phosphatase: 61 U/L (ref 39–117)
BUN: 9 mg/dL (ref 6–23)
CO2: 27 meq/L (ref 19–32)
CREATININE: 0.82 mg/dL (ref 0.40–1.20)
Calcium: 9.3 mg/dL (ref 8.4–10.5)
Chloride: 105 mEq/L (ref 96–112)
GFR: 93.93 mL/min (ref >60.00)
GLUCOSE: 92 mg/dL (ref 70–99)
Potassium: 3.7 mEq/L (ref 3.5–5.1)
SODIUM: 140 meq/L (ref 135–145)
Total Bilirubin: 0.4 mg/dL (ref 0.2–1.2)
Total Protein: 6.7 g/dL (ref 6.0–8.3)

## 2017-10-20 LAB — CBC
HEMATOCRIT: 39.9 % (ref 36.0–46.0)
Hemoglobin: 13.4 g/dL (ref 12.0–15.0)
MCHC: 33.5 g/dL (ref 30.0–36.0)
MCV: 86.4 fl (ref 78.0–100.0)
Platelets: 229 10*3/uL (ref 150.0–400.0)
RBC: 4.61 Mil/uL (ref 3.87–5.11)
RDW: 14.2 % (ref 11.5–14.6)
WBC: 7 10*3/uL (ref 4.5–10.5)

## 2017-10-20 LAB — LIPID PANEL
CHOLESTEROL: 162 mg/dL (ref 0–200)
HDL: 42.8 mg/dL (ref >39.00)
LDL Cholesterol: 96 mg/dL (ref 0–99)
NONHDL: 119.23
Total CHOL/HDL Ratio: 4
Triglycerides: 118 mg/dL (ref 0.0–149.0)
VLDL: 23.6 mg/dL (ref 0.0–40.0)

## 2017-10-20 LAB — TSH: TSH: 4.04 u[IU]/mL (ref 0.35–5.50)

## 2017-10-21 LAB — HIV ANTIBODY (ROUTINE TESTING W REFLEX): HIV: NONREACTIVE

## 2018-03-15 ENCOUNTER — Ambulatory Visit: Payer: No Typology Code available for payment source | Admitting: Family Medicine

## 2018-03-23 ENCOUNTER — Encounter (HOSPITAL_COMMUNITY): Payer: Self-pay | Admitting: Emergency Medicine

## 2018-03-23 ENCOUNTER — Ambulatory Visit (HOSPITAL_COMMUNITY)
Admission: EM | Admit: 2018-03-23 | Discharge: 2018-03-23 | Disposition: A | Discharge status: Home / Self Care | Payer: No Typology Code available for payment source | Attending: Family Medicine | Admitting: Family Medicine

## 2018-03-23 DIAGNOSIS — R109 Unspecified abdominal pain: Secondary | ICD-10-CM | POA: Diagnosis present

## 2018-03-23 DIAGNOSIS — R300 Dysuria: Secondary | ICD-10-CM | POA: Diagnosis not present

## 2018-03-23 DIAGNOSIS — Z720 Tobacco use: Secondary | ICD-10-CM | POA: Diagnosis not present

## 2018-03-23 DIAGNOSIS — Z6837 Body mass index (BMI) 37.0-37.9, adult: Secondary | ICD-10-CM | POA: Diagnosis not present

## 2018-03-23 DIAGNOSIS — F419 Anxiety disorder, unspecified: Secondary | ICD-10-CM | POA: Diagnosis not present

## 2018-03-23 DIAGNOSIS — E669 Obesity, unspecified: Secondary | ICD-10-CM | POA: Diagnosis not present

## 2018-03-23 DIAGNOSIS — Z79899 Other long term (current) drug therapy: Secondary | ICD-10-CM | POA: Insufficient documentation

## 2018-03-23 LAB — POCT URINALYSIS DIP (DEVICE)
Glucose, UA: 100 mg/dL — AB
NITRITE: POSITIVE — AB
PH: 5 (ref 5.0–8.0)
PROTEIN: 100 mg/dL — AB
Specific Gravity, Urine: <=1.005 (ref 1.005–1.030)
UROBILINOGEN UA: 4 mg/dL — AB (ref 0.0–1.0)

## 2018-03-23 LAB — POCT PREGNANCY, URINE: PREG TEST UR: NEGATIVE

## 2018-03-23 MED ORDER — NITROFURANTOIN MONOHYD MACRO 100 MG PO CAPS: 100.0000 mg | ORAL_CAPSULE | Freq: Two times a day (BID) | ORAL | 0 refills | Status: DC

## 2018-03-23 MED FILL — NITROFURANTOIN MONO-MCR 100: 100 | 5 days supply | Qty: 10 | Fill #0

## 2018-03-23 NOTE — ED Provider Notes (Signed)
[place]    CSN: [id number] Arrival date & time: 03/23/18  1352     History   Chief Complaint Chief Complaint  Patient presents with  . Flank Pain  . Dysuria    HPI Lisa Sandoval is a 22 y.o. female history of anxiety, tobacco use presenting today for evaluation of possible UTI.  Patient states that she woke up this morning with dysuria and lower abdominal cramping.  States that this feels like a typical UTI.  She has approximately 2-3 UTIs a year.  Denies associated vaginal discharge, itching or irritation.  Last menstrual period started 10/11.  Patient does not use any form of birth control.  Patient has had some right-sided back pain, initially thought that this was muscular strain from work.  Denies fever, nausea, vomiting.  Used Azo this morning.  HPI  History reviewed. No pertinent past medical history.  Patient Active Problem List   Diagnosis Date Noted  . Anxiety 09/25/2017  . Tobacco abuse 09/25/2017  . Class 2 obesity due to excess calories without serious comorbidity with body mass index (BMI) of 37.0 to 37.9 in adult 09/25/2017  . Encounter for health maintenance examination with abnormal findings 09/25/2017    History reviewed. No pertinent surgical history.  OB History   None      Home Medications    Prior to Admission medications   Medication Sig Start Date End Date Taking? Authorizing Provider  BIOTIN PO Take by mouth.   Yes [provider]  FLUoxetine (PROZAC) 10 MG tablet Take 1 tablet (10 mg total) by mouth daily. Patient not taking: Reported on 03/23/2018 09/25/17   Libby Maw, MD  Melatonin 5 MG TABS Take one nightly before bed. 09/25/17   Libby Maw, MD  nitrofurantoin, macrocrystal-monohydrate, (MACROBID) 100 MG capsule Take 1 capsule (100 mg total) by mouth 2 (two) times daily. 03/23/18   Madi Bonfiglio, Elesa Hacker, PA-C    Family History No family history on file.  Social History Social History    Tobacco Use  . Smoking status: Never Smoker  . Smokeless tobacco: Never Used  Substance Use Topics  . Alcohol use: No  . Drug use: No     Allergies   Sulfa antibiotics   Review of Systems Review of Systems  Constitutional: Negative for fever.  Respiratory: Negative for shortness of breath.   Cardiovascular: Negative for chest pain.  Gastrointestinal: Negative for abdominal pain, diarrhea, nausea and vomiting.  Genitourinary: Positive for dysuria, flank pain and frequency. Negative for genital sores, hematuria, menstrual problem, vaginal bleeding, vaginal discharge and vaginal pain.  Musculoskeletal: Negative for back pain.  Skin: Negative for rash.  Neurological: Negative for dizziness, light-headedness and headaches.     Physical Exam Triage Vital Signs ED Triage Vitals  Enc Vitals Group     BP 03/23/18 1420 130/89     Pulse Rate 03/23/18 1420 88     Resp 03/23/18 1420 16     Temp 03/23/18 1420 97.9 F (36.6 C)     Temp Source 03/23/18 1420 Oral     SpO2 03/23/18 1420 99 %     Weight --      Height --      Head Circumference --      Peak Flow --      Pain Score 03/23/18 1421 7     Pain Loc --      Pain Edu? --      Excl. in [place]? --  No data found.  Updated Vital Signs BP 130/89 (BP Location: Left Arm)   Pulse 88   Temp 97.9 F (36.6 C) (Oral)   Resp 16   LMP 03/16/2018   SpO2 99%   Visual Acuity Right Eye Distance:   Left Eye Distance:   Bilateral Distance:    Right Eye Near:   Left Eye Near:    Bilateral Near:     Physical Exam  Constitutional: She appears well-developed and well-nourished. No distress.  HENT:  Head: Normocephalic and atraumatic.  Eyes: Conjunctivae are normal.  Neck: Neck supple.  Cardiovascular: Normal rate and regular rhythm.  No murmur heard. Pulmonary/Chest: Effort normal and breath sounds normal. No respiratory distress.  Abdominal: Soft. There is no tenderness.  Nontender to light and deep palpation throughout  all 4 quadrants No CVA tenderness  Musculoskeletal: She exhibits no edema.  Neurological: She is alert.  Skin: Skin is warm and dry.  Psychiatric: She has a normal mood and affect.  Nursing note and vitals reviewed.    UC Treatments / Results  Labs (all labs ordered are listed, but only abnormal results are displayed) Labs Reviewed  POCT URINALYSIS DIP (DEVICE) - Abnormal; Notable for the following components:      Result Value   Glucose, UA 100 (*)    Bilirubin Urine SMALL (*)    Ketones, ur TRACE (*)    Hgb urine dipstick LARGE (*)    Protein, ur 100 (*)    Urobilinogen, UA 4.0 (*)    Nitrite POSITIVE (*)    Leukocytes, UA LARGE (*)    All other components within normal limits  URINE CULTURE    EKG None  Radiology No results found.  Procedures Procedures (including critical care time)  Medications Ordered in UC Medications - No data to display  Initial Impression / Assessment and Plan / UC Course  I have reviewed the triage vital signs and the nursing notes.  Pertinent labs & imaging results that were available during my care of the patient were reviewed by me and considered in my medical decision making (see chart for details).     Positive nitrite, large leuks, will treat for UTI with Macrobid.  Vital signs stable, and concerning for pyelonephritis.  Push fluids.  Continue to monitor symptoms and follow-up if not improving with treatment.  Urine culture obtained and will send off to ensure sensitivity to Macrobid, will call follow-up with patient if alteration in therapy needed.Discussed strict return precautions. Patient verbalized understanding and is agreeable with plan.  Final Clinical Impressions(s) / UC Diagnoses   Final diagnoses:  Dysuria     Discharge Instructions     Urine showed evidence of infection. We are treating you with Macrobid- twice daily for 5 days. Be sure to take full course. Stay hydrated- urine should be pale yellow to clear. May  continue azo for relief of burning while infection is being cleared.   Please return or follow up with your primary provider if symptoms not improving with treatment. Please return sooner if you have worsening of symptoms or develop fever, nausea, vomiting, abdominal pain, back pain, lightheadedness, dizziness.   ED Prescriptions    Medication Sig Dispense Auth. Provider   nitrofurantoin, macrocrystal-monohydrate, (MACROBID) 100 MG capsule Take 1 capsule (100 mg total) by mouth 2 (two) times daily. 10 capsule Ludie Pavlik C, PA-C     Controlled Substance Prescriptions [place] Controlled Substance Registry consulted? Not Applicable   Janith Lima, [place] 03/23/18 1440

## 2018-03-23 NOTE — Discharge Instructions (Addendum)
Urine showed evidence of infection. We are treating you with Macrobid- twice daily for 5 days. Be sure to take full course. Stay hydrated- urine should be pale yellow to clear. May continue azo for relief of burning while infection is being cleared.   Please return or follow up with your primary provider if symptoms not improving with treatment. Please return sooner if you have worsening of symptoms or develop fever, nausea, vomiting, abdominal pain, back pain, lightheadedness, dizziness.

## 2018-03-23 NOTE — ED Triage Notes (Signed)
Pt states she has R flank pain and it hurts to pee starting this morning. Pt also c/o cramping in her lower abdomen.

## 2018-03-25 LAB — URINE CULTURE: Culture: 30000 — AB

## 2018-06-14 ENCOUNTER — Encounter: Payer: Self-pay | Admitting: Family Medicine

## 2018-06-14 ENCOUNTER — Ambulatory Visit (INDEPENDENT_AMBULATORY_CARE_PROVIDER_SITE_OTHER): Payer: No Typology Code available for payment source | Admitting: Family Medicine

## 2018-06-14 VITALS — BP 124/80 | HR 95 | Ht 65.0 in | Wt 230.0 lb

## 2018-06-14 DIAGNOSIS — N912 Amenorrhea, unspecified: Secondary | ICD-10-CM | POA: Diagnosis not present

## 2018-06-14 DIAGNOSIS — N911 Secondary amenorrhea: Secondary | ICD-10-CM | POA: Diagnosis not present

## 2018-06-14 LAB — POCT URINE PREGNANCY: Preg Test, Ur: NEGATIVE

## 2018-06-14 NOTE — Patient Instructions (Signed)

## 2018-06-14 NOTE — Progress Notes (Addendum)
Established Patient Office Visit  Subjective:  Patient ID: Lisa Sandoval, female    DOB: 06-25-1995  Age: 23 y.o. MRN: [id number]  CC:  Chief Complaint  Patient presents with  . Follow-up    HPI LOGHAN SUBIA presents for evaluation and treatment for a 14-month history of amenorrhea.  LMP was 03/08/2018.  She tells me that she became pregnant before her following cycle but sustained a spontaneous abortion on 05/05/18.  She did confirm products of conception.  She was seen in a family practice clinic in [place] where her quantitative hCG fell from 800 to 12 over a 1 day.  She has not followed up until today.  She is now G1 P0-0-1-0.  She is interested in achieving pregnancy again.  She has been on no form of contraception.  She is noted no changes in her breast associated with pregnancy.  History reviewed. No pertinent past medical history.  History reviewed. No pertinent surgical history.  History reviewed. No pertinent family history.  Social History   Socioeconomic History  . Marital status: Single    Spouse name: Not on file  . Number of children: Not on file  . Years of education: Not on file  . Highest education level: Not on file  Occupational History  . Not on file  Social Needs  . Financial resource strain: Not on file  . Food insecurity:    Worry: Not on file    Inability: Not on file  . Transportation needs:    Medical: Not on file    Non-medical: Not on file  Tobacco Use  . Smoking status: Never Smoker  . Smokeless tobacco: Never Used  Substance and Sexual Activity  . Alcohol use: No  . Drug use: No  . Sexual activity: Not on file  Lifestyle  . Physical activity:    Days per week: Not on file    Minutes per session: Not on file  . Stress: Not on file  Relationships  . Social connections:    Talks on phone: Not on file    Gets together: Not on file    Attends religious service: Not on file    Active member of club or organization: Not on file      Attends meetings of clubs or organizations: Not on file    Relationship status: Not on file  . Intimate partner violence:    Fear of current or ex partner: Not on file    Emotionally abused: Not on file    Physically abused: Not on file    Forced sexual activity: Not on file  Other Topics Concern  . Not on file  Social History Narrative  . Not on file    Outpatient Medications Prior to Visit  Medication Sig Dispense Refill  . BIOTIN PO Take by mouth.    . Melatonin 5 MG TABS Take one nightly before bed. 90 tablet 0  . FLUoxetine (PROZAC) 10 MG tablet Take 1 tablet (10 mg total) by mouth daily. (Patient not taking: Reported on 03/23/2018) 30 tablet 1  . nitrofurantoin, macrocrystal-monohydrate, (MACROBID) 100 MG capsule Take 1 capsule (100 mg total) by mouth 2 (two) times daily. 10 capsule 0   No facility-administered medications prior to visit.     Allergies  Allergen Reactions  . Sulfa Antibiotics Hives    ROS Review of Systems  Constitutional: Negative for chills, diaphoresis, fatigue, fever and unexpected weight change.  Eyes: Negative for photophobia and visual disturbance.  Respiratory:  Negative.   Cardiovascular: Negative.   Gastrointestinal: Negative.   Endocrine: Negative for polyphagia and polyuria.  Genitourinary: Positive for menstrual problem. Negative for vaginal bleeding, vaginal discharge and vaginal pain.  Musculoskeletal: Negative for arthralgias and myalgias.  Skin: Negative for color change and pallor.  Allergic/Immunologic: Negative for immunocompromised state.  Neurological: Negative for light-headedness, numbness and headaches.  Hematological: Does not bruise/bleed easily.  Psychiatric/Behavioral: Negative.       Objective:    Physical Exam  Constitutional: She is oriented to person, place, and time. She appears well-developed and well-nourished. No distress.  HENT:  Head: Normocephalic and atraumatic.  Right Ear: External ear normal.   Left Ear: External ear normal.  Eyes: Conjunctivae are normal. Right eye exhibits no discharge. Left eye exhibits no discharge. No scleral icterus.  Neck: Neck supple. No JVD present. No tracheal deviation present.  Pulmonary/Chest: Effort normal. No stridor.  Neurological: She is alert and oriented to person, place, and time.  Skin: Skin is warm and dry. She is not diaphoretic.  Psychiatric: She has a normal mood and affect. Her behavior is normal.    BP 124/80   Pulse 95   Ht 5\' 5"  (1.651 m)   Wt 230 lb (104.3 kg)   SpO2 99%   BMI 38.27 kg/m  Wt Readings from Last 3 Encounters:  06/14/18 230 lb (104.3 kg)  09/25/17 223 lb 2 oz (101.2 kg)   BP Readings from Last 3 Encounters:  06/14/18 124/80  03/23/18 130/89  09/25/17 126/80   Guideline developer:  UpToDate (see UpToDate for funding source) Date Released: June 2014  Health Maintenance Due  Topic Date Due  . Samul Dada  03/29/2016  . INFLUENZA VACCINE  01/04/2018  . PAP-Cervical Cytology Screening  03/29/2018  . PAP SMEAR-Modifier  03/29/2018    There are no preventive care reminders to display for this patient.  Lab Results  Component Value Date   TSH 2.31 06/14/2018   Lab Results  Component Value Date   WBC 7.0 10/20/2017   HGB 13.4 10/20/2017   HCT 39.9 10/20/2017   MCV 86.4 10/20/2017   PLT 229.0 10/20/2017   Lab Results  Component Value Date   NA 140 10/20/2017   K 3.7 10/20/2017   CO2 27 10/20/2017   GLUCOSE 92 10/20/2017   BUN 9 10/20/2017   CREATININE 0.82 10/20/2017   BILITOT 0.4 10/20/2017   ALKPHOS 61 10/20/2017   AST 12 10/20/2017   ALT 14 10/20/2017   PROT 6.7 10/20/2017   ALBUMIN 4.1 10/20/2017   CALCIUM 9.3 10/20/2017   ANIONGAP 10 06/04/2015   GFR 93.93 10/20/2017   Lab Results  Component Value Date   CHOL 162 10/20/2017   Lab Results  Component Value Date   HDL 42.80 10/20/2017   Lab Results  Component Value Date   LDLCALC 96 10/20/2017   Lab Results  Component  Value Date   TRIG 118.0 10/20/2017   Lab Results  Component Value Date   CHOLHDL 4 10/20/2017   No results found for: HGBA1C    Assessment & Plan:   Problem List Items Addressed This Visit      Other   Amenorrhea, secondary - Primary   Relevant Medications   medroxyPROGESTERone (PROVERA) 10 MG tablet   Other Relevant Orders   B-HCG Quant (Completed)   hCG, serum, qualitative (Completed)   POCT urine pregnancy (Completed)   FSH (Completed)   LH (Completed)   TSH (Completed)   Prolactin (Completed)   Ambulatory  referral to Obstetrics / Gynecology      Meds ordered this encounter  Medications  . medroxyPROGESTERone (PROVERA) 10 MG tablet    Sig: Take 1 tablet (10 mg total) by mouth daily. Until menses.    Dispense:  10 tablet    Refill:  0    Follow-up: No follow-ups on file.   Urine pregnancy negative today.  Will consider progesterone challenge or withdrawal bleeding PRN negative work-up above.  Question PCO S.  I have referred for GYN follow-up.

## 2018-06-15 LAB — HCG, QUANTITATIVE, PREGNANCY: Quantitative HCG: 0.04 m[IU]/mL

## 2018-06-15 LAB — TSH: TSH: 2.31 u[IU]/mL (ref 0.35–4.50)

## 2018-06-15 LAB — PROLACTIN: PROLACTIN: 13 ng/mL

## 2018-06-15 LAB — HCG, SERUM, QUALITATIVE: PREG SERUM: NEGATIVE

## 2018-06-15 LAB — FOLLICLE STIMULATING HORMONE: FSH: 7.3 m[IU]/mL

## 2018-06-15 LAB — LUTEINIZING HORMONE: LH: 6.5 m[IU]/mL

## 2018-06-18 MED ORDER — MEDROXYPROGESTERONE ACETATE 10 MG PO TABS: 10.0000 mg | ORAL_TABLET | Freq: Every day | ORAL | 0 refills | Status: DC

## 2018-06-18 NOTE — Addendum Note (Signed)
Addended by: Jon Billings on: 06/18/2018 08:54 AM   Modules accepted: Orders

## 2018-08-22 DIAGNOSIS — N96 Recurrent pregnancy loss: Secondary | ICD-10-CM | POA: Diagnosis not present

## 2018-08-22 DIAGNOSIS — Z716 Tobacco abuse counseling: Secondary | ICD-10-CM | POA: Diagnosis not present

## 2018-09-02 DIAGNOSIS — Z87891 Personal history of nicotine dependence: Secondary | ICD-10-CM | POA: Diagnosis not present

## 2018-09-02 DIAGNOSIS — R000 Tachycardia, unspecified: Secondary | ICD-10-CM | POA: Diagnosis not present

## 2018-09-02 DIAGNOSIS — Z882 Allergy status to sulfonamides status: Secondary | ICD-10-CM | POA: Diagnosis not present

## 2018-09-28 DIAGNOSIS — O219 Vomiting of pregnancy, unspecified: Secondary | ICD-10-CM | POA: Diagnosis not present

## 2018-09-28 DIAGNOSIS — Z20828 Contact with and (suspected) exposure to other viral communicable diseases: Secondary | ICD-10-CM | POA: Diagnosis not present

## 2018-09-28 DIAGNOSIS — Z7982 Long term (current) use of aspirin: Secondary | ICD-10-CM | POA: Diagnosis not present

## 2018-09-28 DIAGNOSIS — Z3A00 Weeks of gestation of pregnancy not specified: Secondary | ICD-10-CM | POA: Diagnosis not present

## 2018-09-28 DIAGNOSIS — Z87891 Personal history of nicotine dependence: Secondary | ICD-10-CM | POA: Diagnosis not present

## 2018-09-28 DIAGNOSIS — O210 Mild hyperemesis gravidarum: Secondary | ICD-10-CM | POA: Diagnosis not present

## 2018-09-28 DIAGNOSIS — Z882 Allergy status to sulfonamides status: Secondary | ICD-10-CM | POA: Diagnosis not present

## 2018-10-03 DIAGNOSIS — O210 Mild hyperemesis gravidarum: Secondary | ICD-10-CM | POA: Diagnosis not present

## 2018-10-03 DIAGNOSIS — Z3480 Encounter for supervision of other normal pregnancy, unspecified trimester: Secondary | ICD-10-CM | POA: Diagnosis not present

## 2018-10-03 DIAGNOSIS — O26849 Uterine size-date discrepancy, unspecified trimester: Secondary | ICD-10-CM | POA: Diagnosis not present

## 2018-10-03 DIAGNOSIS — Z124 Encounter for screening for malignant neoplasm of cervix: Secondary | ICD-10-CM | POA: Diagnosis not present

## 2018-10-03 MED FILL — PROMETHAZINE 25 MG TABLET: 25 | 15 days supply | Qty: 90 | Fill #0

## 2018-11-06 DIAGNOSIS — Z3480 Encounter for supervision of other normal pregnancy, unspecified trimester: Secondary | ICD-10-CM | POA: Diagnosis not present

## 2018-11-06 DIAGNOSIS — Z3682 Encounter for antenatal screening for nuchal translucency: Secondary | ICD-10-CM | POA: Diagnosis not present

## 2018-11-06 LAB — OB RESULTS CONSOLE HEPATITIS B SURFACE ANTIGEN: Hepatitis B Surface Ag: NEGATIVE

## 2018-11-06 LAB — OB RESULTS CONSOLE RUBELLA ANTIBODY, IGM: Rubella: IMMUNE

## 2018-11-06 LAB — OB RESULTS CONSOLE RPR: RPR: NONREACTIVE

## 2018-11-06 LAB — OB RESULTS CONSOLE HIV ANTIBODY (ROUTINE TESTING): HIV: NONREACTIVE

## 2018-11-06 MED FILL — ONDANSETRON HCL 8 MG TABLET: 8 | 20 days supply | Qty: 60 | Fill #0

## 2018-11-07 MED FILL — PROMETHAZINE 25 MG TABLET: 25 | 15 days supply | Qty: 90 | Fill #1

## 2018-11-27 DIAGNOSIS — Z3482 Encounter for supervision of other normal pregnancy, second trimester: Secondary | ICD-10-CM | POA: Diagnosis not present

## 2018-11-27 DIAGNOSIS — Z369 Encounter for antenatal screening, unspecified: Secondary | ICD-10-CM | POA: Diagnosis not present

## 2018-12-25 DIAGNOSIS — O10012 Pre-existing essential hypertension complicating pregnancy, second trimester: Secondary | ICD-10-CM | POA: Diagnosis not present

## 2018-12-25 DIAGNOSIS — Z363 Encounter for antenatal screening for malformations: Secondary | ICD-10-CM | POA: Diagnosis not present

## 2019-01-23 MED FILL — LABETALOL HCL 100MG TABLET: 100 | 45 days supply | Qty: 90 | Fill #0

## 2019-02-27 ENCOUNTER — Inpatient Hospital Stay (HOSPITAL_COMMUNITY)
Admission: AD | Admit: 2019-02-27 | Discharge: 2019-02-27 | Disposition: A | Discharge status: Home / Self Care | Payer: Medicaid Other | Attending: Obstetrics and Gynecology | Admitting: Obstetrics and Gynecology

## 2019-02-27 ENCOUNTER — Other Ambulatory Visit: Payer: Self-pay

## 2019-02-27 ENCOUNTER — Inpatient Hospital Stay (HOSPITAL_BASED_OUTPATIENT_CLINIC_OR_DEPARTMENT_OTHER): Payer: Medicaid Other

## 2019-02-27 ENCOUNTER — Encounter (HOSPITAL_COMMUNITY): Payer: Self-pay | Admitting: *Deleted

## 2019-02-27 DIAGNOSIS — O368130 Decreased fetal movements, third trimester, not applicable or unspecified: Secondary | ICD-10-CM | POA: Diagnosis present

## 2019-02-27 DIAGNOSIS — O24419 Gestational diabetes mellitus in pregnancy, unspecified control: Secondary | ICD-10-CM

## 2019-02-27 DIAGNOSIS — O113 Pre-existing hypertension with pre-eclampsia, third trimester: Secondary | ICD-10-CM | POA: Diagnosis not present

## 2019-02-27 DIAGNOSIS — Z79899 Other long term (current) drug therapy: Secondary | ICD-10-CM | POA: Diagnosis not present

## 2019-02-27 DIAGNOSIS — Z3A28 28 weeks gestation of pregnancy: Secondary | ICD-10-CM

## 2019-02-27 DIAGNOSIS — O119 Pre-existing hypertension with pre-eclampsia, unspecified trimester: Secondary | ICD-10-CM

## 2019-02-27 DIAGNOSIS — Z793 Long term (current) use of hormonal contraceptives: Secondary | ICD-10-CM | POA: Diagnosis not present

## 2019-02-27 DIAGNOSIS — Z3689 Encounter for other specified antenatal screening: Secondary | ICD-10-CM | POA: Diagnosis not present

## 2019-02-27 DIAGNOSIS — O368190 Decreased fetal movements, unspecified trimester, not applicable or unspecified: Secondary | ICD-10-CM

## 2019-02-27 DIAGNOSIS — O99213 Obesity complicating pregnancy, third trimester: Secondary | ICD-10-CM

## 2019-02-27 DIAGNOSIS — Z7982 Long term (current) use of aspirin: Secondary | ICD-10-CM | POA: Insufficient documentation

## 2019-02-27 DIAGNOSIS — Z87891 Personal history of nicotine dependence: Secondary | ICD-10-CM | POA: Insufficient documentation

## 2019-02-27 LAB — CBC
HCT: 35.2 % — ABNORMAL LOW (ref 36.0–46.0)
Hemoglobin: 11.4 g/dL — ABNORMAL LOW (ref 12.0–15.0)
MCH: 28.3 pg (ref 26.0–34.0)
MCHC: 32.4 g/dL (ref 30.0–36.0)
MCV: 87.3 fL (ref 80.0–100.0)
Platelets: 234 10*3/uL (ref 150–400)
RBC: 4.03 MIL/uL (ref 3.87–5.11)
RDW: 13.8 % (ref 11.5–15.5)
WBC: 11.8 10*3/uL — ABNORMAL HIGH (ref 4.0–10.5)
nRBC: 0 % (ref 0.0–0.2)

## 2019-02-27 LAB — COMPREHENSIVE METABOLIC PANEL
ALT: 18 U/L (ref 0–44)
AST: 16 U/L (ref 15–41)
Albumin: 2.5 g/dL — ABNORMAL LOW (ref 3.5–5.0)
Alkaline Phosphatase: 70 U/L (ref 38–126)
Anion gap: 9 (ref 5–15)
BUN: 5 mg/dL — ABNORMAL LOW (ref 6–20)
CO2: 23 mmol/L (ref 22–32)
Calcium: 9.1 mg/dL (ref 8.9–10.3)
Chloride: 107 mmol/L (ref 98–111)
Creatinine, Ser: 0.61 mg/dL (ref 0.44–1.00)
GFR calc Af Amer: >60 mL/min (ref >60)
GFR calc non Af Amer: >60 mL/min (ref >60)
Glucose, Bld: 78 mg/dL (ref 70–99)
Potassium: 3.8 mmol/L (ref 3.5–5.1)
Sodium: 139 mmol/L (ref 135–145)
Total Bilirubin: 0.1 mg/dL — ABNORMAL LOW (ref 0.3–1.2)
Total Protein: 5.8 g/dL — ABNORMAL LOW (ref 6.5–8.1)

## 2019-02-27 LAB — URINALYSIS, ROUTINE W REFLEX MICROSCOPIC
Bilirubin Urine: NEGATIVE
Glucose, UA: NEGATIVE mg/dL
Ketones, ur: 5 mg/dL — AB
Nitrite: NEGATIVE
Protein, ur: 100 mg/dL — AB
Specific Gravity, Urine: 1.016 (ref 1.005–1.030)
pH: 7 (ref 5.0–8.0)

## 2019-02-27 LAB — PROTEIN / CREATININE RATIO, URINE
Creatinine, Urine: 83.98 mg/dL
Protein Creatinine Ratio: 0.74 mg/mg{Cre} — ABNORMAL HIGH (ref 0.00–0.15)
Total Protein, Urine: 62 mg/dL

## 2019-02-27 NOTE — MAU Note (Signed)
Pt presents with c/o decreased FM, instructed by office to be seen for NST.  Denies LOF or VB.

## 2019-02-27 NOTE — Discharge Instructions (Signed)
Preterm Labor and Birth Information  The normal length of a pregnancy is 39-41 weeks. Preterm labor is when labor starts before 37 completed weeks of pregnancy. What are the risk factors for preterm labor? Preterm labor is more likely to occur in women who:  Have certain infections during pregnancy such as a bladder infection, sexually transmitted infection, or infection inside the uterus (chorioamnionitis).  Have a shorter-than-normal cervix.  Have gone into preterm labor before.  Have had surgery on their cervix.  Are younger than age 22 or older than age 22.  Are African American.  Are pregnant with twins or multiple babies (multiple gestation).  Take street drugs or smoke while pregnant.  Do not gain enough weight while pregnant.  Became pregnant shortly after having been pregnant. What are the symptoms of preterm labor? Symptoms of preterm labor include:  Cramps similar to those that can happen during a menstrual period. The cramps may happen with diarrhea.  Pain in the abdomen or lower back.  Regular uterine contractions that may feel like tightening of the abdomen.  A feeling of increased pressure in the pelvis.  Increased watery or bloody mucus discharge from the vagina.  Water breaking (ruptured amniotic sac). Why is it important to recognize signs of preterm labor? It is important to recognize signs of preterm labor because babies who are born prematurely may not be fully developed. This can put them at an increased risk for:  Long-term (chronic) heart and lung problems.  Difficulty immediately after birth with regulating body systems, including blood sugar, body temperature, heart rate, and breathing rate.  Bleeding in the brain.  Cerebral palsy.  Learning difficulties.  Death. These risks are highest for babies who are born before 38 weeks of pregnancy. How is preterm labor treated? Treatment depends on the length of your pregnancy, your condition,  and the health of your baby. It may involve:  Having a stitch (suture) placed in your cervix to prevent your cervix from opening too early (cerclage).  Taking or being given medicines, such as: ? Hormone medicines. These may be given early in pregnancy to help support the pregnancy. ? Medicine to stop contractions. ? Medicines to help mature the babys lungs. These may be prescribed if the risk of delivery is high. ? Medicines to prevent your baby from developing cerebral palsy. If the labor happens before 34 weeks of pregnancy, you may need to stay in the hospital. What should I do if I think I am in preterm labor? If you think that you are going into preterm labor, call your health care provider right away. How can I prevent preterm labor in future pregnancies? To increase your chance of having a full-term pregnancy:  Do not use any tobacco products, such as cigarettes, chewing tobacco, and e-cigarettes. If you need help quitting, ask your health care provider.  Do not use street drugs or medicines that have not been prescribed to you during your pregnancy.  Talk with your health care provider before taking any herbal supplements, even if you have been taking them regularly.  Make sure you gain a healthy amount of weight during your pregnancy.  Watch for infection. If you think that you might have an infection, get it checked right away.  Make sure to tell your health care provider if you have gone into preterm labor before. This information is not intended to replace advice given to you by your health care provider. Make sure you discuss any questions you have with your  health care provider. Document Released: 08/13/2003 Document Revised: 09/14/2018 Document Reviewed: 10/14/2015 Elsevier Patient Education  2020 [place]. Fetal Movement Counts Patient Name: ________________________________________________ Patient Due Date: ____________________ What is a fetal movement  count?  A fetal movement count is the number of times that you feel your baby move during a certain amount of time. This may also be called a fetal kick count. A fetal movement count is recommended for every pregnant woman. You may be asked to start counting fetal movements as early as week 28 of your pregnancy. Pay attention to when your baby is most active. You may notice your baby's sleep and wake cycles. You may also notice things that make your baby move more. You should do a fetal movement count:  When your baby is normally most active.  At the same time each day. A good time to count movements is while you are resting, after having something to eat and drink. How do I count fetal movements? 1. Find a quiet, comfortable area. Sit, or lie down on your side. 2. Write down the date, the start time and stop time, and the number of movements that you felt between those two times. Take this information with you to your health care visits. 3. For 2 hours, count kicks, flutters, swishes, rolls, and jabs. You should feel at least 10 movements during 2 hours. 4. You may stop counting after you have felt 10 movements. 5. If you do not feel 10 movements in 2 hours, have something to eat and drink. Then, keep resting and counting for 1 hour. If you feel at least 4 movements during that hour, you may stop counting. Contact a health care provider if:  You feel fewer than 4 movements in 2 hours.  Your baby is not moving like he or she usually does. Date: ____________ Start time: ____________ Stop time: ____________ Movements: ____________ Date: ____________ Start time: ____________ Stop time: ____________ Movements: ____________ Date: ____________ Start time: ____________ Stop time: ____________ Movements: ____________ Date: ____________ Start time: ____________ Stop time: ____________ Movements: ____________ Date: ____________ Start time: ____________ Stop time: ____________ Movements:  ____________ Date: ____________ Start time: ____________ Stop time: ____________ Movements: ____________ Date: ____________ Start time: ____________ Stop time: ____________ Movements: ____________ Date: ____________ Start time: ____________ Stop time: ____________ Movements: ____________ Date: ____________ Start time: ____________ Stop time: ____________ Movements: ____________ This information is not intended to replace advice given to you by your health care provider. Make sure you discuss any questions you have with your health care provider. Document Released: 06/22/2006 Document Revised: 06/12/2018 Document Reviewed: 07/02/2015 Elsevier Patient Education  [place]. Preeclampsia and Eclampsia Preeclampsia is a serious condition that may develop during pregnancy. This condition causes high blood pressure and increased protein in your urine along with other symptoms, such as headaches and vision changes. These symptoms may develop as the condition gets worse. Preeclampsia may occur at 20 weeks of pregnancy or later. Diagnosing and treating preeclampsia early is very important. If not treated early, it can cause serious problems for you and your baby. One problem it can lead to is eclampsia. Eclampsia is a condition that causes muscle jerking or shaking (convulsions or seizures) and other serious problems for the mother. During pregnancy, delivering your baby may be the best treatment for preeclampsia or eclampsia. For most women, preeclampsia and eclampsia symptoms go away after giving birth. In rare cases, a woman may develop preeclampsia after giving birth (postpartum preeclampsia). This usually occurs within 48 hours after  childbirth but may occur up to 6 weeks after giving birth. What are the causes? The cause of preeclampsia is not known. What increases the risk? The following risk factors make you more likely to develop preeclampsia:  Being pregnant for the first time.  Having  had preeclampsia during a past pregnancy.  Having a family history of preeclampsia.  Having high blood pressure.  Being pregnant with more than one baby.  Being 61 or older.  Being African-American.  Having kidney disease or diabetes.  Having medical conditions such as lupus or blood diseases.  Being very overweight (obese). What are the signs or symptoms? The most common symptoms are:  Severe headaches.  Vision problems, such as blurred or double vision.  Abdominal pain, especially upper abdominal pain. Other symptoms that may develop as the condition gets worse include:  Sudden weight gain.  Sudden swelling of the hands, face, legs, and feet.  Severe nausea and vomiting.  Numbness in the face, arms, legs, and feet.  Dizziness.  Urinating less than usual.  Slurred speech.  Convulsions or seizures. How is this diagnosed? There are no screening tests for preeclampsia. Your health care provider will ask you about symptoms and check for signs of preeclampsia during your prenatal visits. You may also have tests that include:  Checking your blood pressure.  Urine tests to check for protein. Your health care provider will check for this at every prenatal visit.  Blood tests.  Monitoring your baby's heart rate.  Ultrasound. How is this treated? You and your health care provider will determine the treatment approach that is best for you. Treatment may include:  Having more frequent prenatal exams to check for signs of preeclampsia, if you have an increased risk for preeclampsia.  Medicine to lower your blood pressure.  Staying in the hospital, if your condition is severe. There, treatment will focus on controlling your blood pressure and the amount of fluids in your body (fluid retention).  Taking medicine (magnesium sulfate) to prevent seizures. This may be given as an injection or through an IV.  Taking a low-dose aspirin during your pregnancy.  Delivering  your baby early. You may have your labor started with medicine (induced), or you may have a cesarean delivery. Follow these instructions at home: Eating and drinking   Drink enough fluid to keep your urine pale yellow.  Avoid caffeine. Lifestyle  Do not use any products that contain nicotine or tobacco, such as cigarettes and e-cigarettes. If you need help quitting, ask your health care provider.  Do not use alcohol or drugs.  Avoid stress as much as possible. Rest and get plenty of sleep. General instructions  Take over-the-counter and prescription medicines only as told by your health care provider.  When lying down, lie on your left side. This keeps pressure off your major blood vessels.  When sitting or lying down, raise (elevate) your feet. Try putting some pillows underneath your lower legs.  Exercise regularly. Ask your health care provider what kinds of exercise are best for you.  Keep all follow-up and prenatal visits as told by your health care provider. This is important. How is this prevented? There is no known way of preventing preeclampsia or eclampsia from developing. However, to lower your risk of complications and detect problems early:  Get regular prenatal care. Your health care provider may be able to diagnose and treat the condition early.  Maintain a healthy weight. Ask your health care provider for help managing weight gain during  pregnancy.  Work with your health care provider to manage any long-term (chronic) health conditions you have, such as diabetes or kidney problems.  You may have tests of your blood pressure and kidney function after giving birth.  Your health care provider may have you take low-dose aspirin during your next pregnancy. Contact a health care provider if:  You have symptoms that your health care provider told you may require more treatment or monitoring, such as: ? Headaches. ? Nausea or vomiting. ? Abdominal  pain. ? Dizziness. ? Light-headedness. Get help right away if:  You have severe: ? Abdominal pain. ? Headaches that do not get better. ? Dizziness. ? Vision problems. ? Confusion. ? Nausea or vomiting.  You have any of the following: ? A seizure. ? Sudden, rapid weight gain. ? Sudden swelling in your hands, ankles, or face. ? Trouble moving any part of your body. ? Numbness in any part of your body. ? Trouble speaking. ? Abnormal bleeding.  You faint. Summary  Preeclampsia is a serious condition that may develop during pregnancy.  This condition causes high blood pressure and increased protein in your urine along with other symptoms, such as headaches and vision changes.  Diagnosing and treating preeclampsia early is very important. If not treated early, it can cause serious problems for you and your baby.  Get help right away if you have symptoms that your health care provider told you to watch for. This information is not intended to replace advice given to you by your health care provider. Make sure you discuss any questions you have with your health care provider. Document Released: 05/20/2000 Document Revised: 01/23/2018 Document Reviewed: 12/28/2015 Elsevier Patient Education  2020 [employer].

## 2019-02-27 NOTE — MAU Provider Note (Signed)
History     CSN: [id number]  Arrival date and time: 02/27/19 1137   First Provider Initiated Contact with Patient 02/27/19 1238      Chief Complaint  Patient presents with  . Decreased Fetal Movement   [name] is a 23 y.o. G2P0010 at [redacted]w[redacted]d who presents to MAU for feeling decreased fetal movement beginning two weeks ago. Pt reports her office called her today because of elevated glucose testing and to let her know she needs a 3hr test, they asked how she was feeling and pt reported DFM and pt was told to come to MAU for NST.  Last food/drink: 0730 today Smoker? no Current medications/supplements: labetalol 100mg  BID, 81mg  bASA, PNVs Recent AFI: no [place] on file Anterior placenta? No [place] on file Doing FKCs? no Problems this pregnancy include: cHTN Pt denies prior instances of DFM. Pt denies all risk factors for stillbirth, including, but not limited to: IUGR, placental abruption, infection, genetic/congenital anomalies, fetomaternal hemorrhage, DM, HTN, smoking/drug use, umbilical cord/placental abnormalities, uterine abnormalities, fetal hydrops, arrythmia, platelet dysfunction, IHCP.  Pt denies VB, LOF, ctx, vaginal discharge/odor/itching.  Allergies? sulfa Prenatal care provider/next appt? Esmond Plants, 03/06/2019   OB History    Gravida  2   Para      Term      Preterm      AB  1   Living  0     SAB  1   TAB      Ectopic      Multiple      Live Births  0           Past Medical History:  Diagnosis Date  . Pregnancy induced hypertension     Past Surgical History:  Procedure Laterality Date  . BACK SURGERY      Family History  Problem Relation Age of Onset  . Diabetes Mother   . Diabetes Father     Social History   Tobacco Use  . Smoking status: Former Smoker    Types: Cigarettes  . Smokeless tobacco: Never Used  Substance Use Topics  . Alcohol use: No  . Drug use: No    Allergies:  Allergies  Allergen Reactions  .  Sulfa Antibiotics Hives    No medications prior to admission.    Review of Systems  Constitutional: Negative for chills, diaphoresis, fatigue and fever.  Eyes: Negative for visual disturbance.  Respiratory: Negative for shortness of breath.   Cardiovascular: Negative for chest pain.  Gastrointestinal: Negative for abdominal pain, constipation, diarrhea, nausea and vomiting.  Genitourinary: Negative for dysuria, flank pain, frequency, pelvic pain, urgency, vaginal bleeding and vaginal discharge.  Neurological: Negative for dizziness, weakness, light-headedness and headaches.   Physical Exam   Blood pressure (!) 137/91, pulse 94, temperature 98.1 F (36.7 C), temperature source Oral, resp. rate 20, height 5\' 4"  (1.626 m), weight 128.9 kg, last menstrual period 03/16/2018, SpO2 100 %.  Patient Vitals for the past 24 hrs:  BP Temp Temp src Pulse Resp SpO2 Height Weight  02/27/19 1648 (!) 137/91 - - 94 - - - -  02/27/19 1643 (!) 144/102 98.1 F (36.7 C) Oral 93 20 100 % - -  02/27/19 1345 137/89 - - (!) 107 - - - -  02/27/19 1330 131/82 - - 87 - - - -  02/27/19 1300 (!) 147/83 - - 89 - - - -  02/27/19 1245 (!) 142/91 - - 95 - - - -  02/27/19 1230 (!) 142/79 - -  85 - - - -  02/27/19 1215 139/90 - - 87 - - - -  02/27/19 1203 (!) 142/83 98.4 F (36.9 C) Oral 93 20 100 % - -  02/27/19 1151 - - - - - - 5\' 4"  (1.626 m) 128.9 kg   Physical Exam  Constitutional: She is oriented to person, place, and time. She appears well-developed and well-nourished. No distress.  HENT:  Head: Normocephalic and atraumatic.  Respiratory: Effort normal.  GI: Soft. She exhibits no distension and no mass. There is no abdominal tenderness. There is no rebound and no guarding.  Neurological: She is alert and oriented to person, place, and time.  Skin: Skin is warm and dry. She is not diaphoretic.  Psychiatric: She has a normal mood and affect. Her behavior is normal. Judgment and thought content normal.    Results for orders placed or performed during the hospital encounter of 02/27/19 (from the past 24 hour(s))  Urinalysis, Routine w reflex microscopic     Status: Abnormal   Collection Time: 02/27/19  1:24 PM  Result Value Ref Range   Color, Urine YELLOW YELLOW   APPearance HAZY (A) CLEAR   Specific Gravity, Urine 1.016 1.005 - 1.030   pH 7.0 5.0 - 8.0   Glucose, UA NEGATIVE NEGATIVE mg/dL   Hgb urine dipstick MODERATE (A) NEGATIVE   Bilirubin Urine NEGATIVE NEGATIVE   Ketones, ur 5 (A) NEGATIVE mg/dL   Protein, ur 100 (A) NEGATIVE mg/dL   Nitrite NEGATIVE NEGATIVE   Leukocytes,Ua TRACE (A) NEGATIVE   RBC / HPF 6-10 0 - 5 RBC/hpf   WBC, UA 6-10 0 - 5 WBC/hpf   Bacteria, UA RARE (A) NONE SEEN   Squamous Epithelial / LPF 11-20 0 - 5   Mucus PRESENT   Protein / creatinine ratio, urine     Status: Abnormal   Collection Time: 02/27/19  1:30 PM  Result Value Ref Range   Creatinine, Urine 83.98 mg/dL   Total Protein, Urine 62 mg/dL   Protein Creatinine Ratio 0.74 (H) 0.00 - 0.15 mg/mg[Cre]  CBC     Status: Abnormal   Collection Time: 02/27/19  1:54 PM  Result Value Ref Range   WBC 11.8 (H) 4.0 - 10.5 K/uL   RBC 4.03 3.87 - 5.11 MIL/uL   Hemoglobin 11.4 (L) 12.0 - 15.0 g/dL   HCT 35.2 (L) 36.0 - 46.0 %   MCV 87.3 80.0 - 100.0 fL   MCH 28.3 26.0 - 34.0 pg   MCHC 32.4 30.0 - 36.0 g/dL   RDW 13.8 11.5 - 15.5 %   Platelets 234 150 - 400 K/uL   nRBC 0.0 0.0 - 0.2 %  Comprehensive metabolic panel     Status: Abnormal   Collection Time: 02/27/19  1:54 PM  Result Value Ref Range   Sodium 139 135 - 145 mmol/L   Potassium 3.8 3.5 - 5.1 mmol/L   Chloride 107 98 - 111 mmol/L   CO2 23 22 - 32 mmol/L   Glucose, Bld 78 70 - 99 mg/dL   BUN 5 (L) 6 - 20 mg/dL   Creatinine, Ser 0.61 0.44 - 1.00 mg/dL   Calcium 9.1 8.9 - 10.3 mg/dL   Total Protein 5.8 (L) 6.5 - 8.1 g/dL   Albumin 2.5 (L) 3.5 - 5.0 g/dL   AST 16 15 - 41 U/L   ALT 18 0 - 44 U/L   Alkaline Phosphatase 70 38 - 126 U/L    Total Bilirubin 0.1 (L) 0.3 -  1.2 mg/dL   GFR calc non Af Amer >60 >60 mL/min   GFR calc Af Amer >60 >60 mL/min   Anion gap 9 5 - 15   No results found.  MAU Course  Procedures  MDM -DFM -EFM: reactive with variables       -baseline: 135       -variability: moderate       -accels: present, 15x15       -decels: few variables       -TOCO: no ctx -BPP: 8/8, anterior placenta, AFI 21.21cm -food and drink given to patient, pt reports in MAU she is feeling fetal movement -preeclampsia work-up performed, as BP elevated on admission, pt has cHTN, on labetalol 100mg  BID -UA: hazy/mod hgb/5ketones/100PRO/trace leuks/rare bacteria, sending urine for culture -CBC: WNL for pregnancy, platelets 234 -CMP: no abnormalities requiring treatment, AST/ALT 16/18 -PCr: 0.74 -pt discharged to home in stable condition  Orders Placed This Encounter  Procedures  . Culture, OB Urine    Standing Status:   Standing    Number of Occurrences:   1  . [place] MFM FETAL BPP WO NON STRESS    Standing Status:   Standing    Number of Occurrences:   1    Order Specific Question:   Symptom/Reason for Exam    Answer:   Decreased fetal movement [id number]  . CBC    Standing Status:   Standing    Number of Occurrences:   1  . Comprehensive metabolic panel    Standing Status:   Standing    Number of Occurrences:   1  . Protein / creatinine ratio, urine    Standing Status:   Standing    Number of Occurrences:   1  . Urinalysis, Routine w reflex microscopic    Standing Status:   Standing    Number of Occurrences:   1  . Discharge patient    Order Specific Question:   Discharge disposition    Answer:   01-Home or Self Care [1]    Order Specific Question:   Discharge patient date    Answer:   02/27/2019   No orders of the defined types were placed in this encounter.  Assessment and Plan   1. Decreased fetal movement   2. Chronic hypertension with superimposed preeclampsia   3. [redacted] weeks gestation of pregnancy    4. NST (non-stress test) reactive    Allergies as of 02/27/2019      Reactions   Sulfa Antibiotics Hives      Medication List    STOP taking these medications   BIOTIN PO   medroxyPROGESTERone 10 MG tablet Commonly known as: PROVERA   Melatonin 5 MG Tabs     TAKE these medications   aspirin EC 81 MG tablet Take 81 mg by mouth daily.   FLUoxetine 10 MG tablet Commonly known as: PROZAC Take 1 tablet (10 mg total) by mouth daily.   labetalol 100 MG tablet Commonly known as: NORMODYNE Take 100 mg by mouth 2 (two) times daily.   prenatal vitamin w/FE, FA 27-1 MG Tabs tablet Take 1 tablet by mouth daily at 12 noon.      -discussed contributing factors to feeling and not feeling FM, including upper normal AFI, anterior placenta, first pregnancy to reach viability -discussed regular FM begins at 28wks -discussed appropriate way to perform [facility] and ways to elicit FM at home, information given -discussed normal fetal sleep cycles -discussed normal fetal movement across gestational age -preeclampsia  s/sx discussed and information given, pt advised when to return to MAU based on symptoms or severe range BP -pt to call office to schedule BP check on Friday -pt to take BP at home twice daily -strict preeclampsia/DFM/VB/pain/return MAU precautions given -pt discharged to home in stable condition  Gerrie Nordmann Diaz Crago 02/27/2019, 6:56 PM

## 2019-02-27 NOTE — Progress Notes (Signed)
FM audible, pt denies feeling any movement.

## 2019-02-28 LAB — CULTURE, OB URINE

## 2019-03-05 ENCOUNTER — Inpatient Hospital Stay (HOSPITAL_COMMUNITY)
Admission: AD | Admit: 2019-03-05 | Discharge: 2019-03-08 | DRG: 833 | Disposition: A | Discharge status: Home / Self Care | Payer: Medicaid Other | Attending: Obstetrics and Gynecology | Admitting: Obstetrics and Gynecology

## 2019-03-05 ENCOUNTER — Other Ambulatory Visit: Payer: Self-pay

## 2019-03-05 ENCOUNTER — Observation Stay (HOSPITAL_BASED_OUTPATIENT_CLINIC_OR_DEPARTMENT_OTHER): Payer: Medicaid Other

## 2019-03-05 ENCOUNTER — Encounter (HOSPITAL_COMMUNITY): Payer: Self-pay | Admitting: Obstetrics and Gynecology

## 2019-03-05 DIAGNOSIS — O10019 Pre-existing essential hypertension complicating pregnancy, unspecified trimester: Secondary | ICD-10-CM | POA: Diagnosis present

## 2019-03-05 DIAGNOSIS — Z20828 Contact with and (suspected) exposure to other viral communicable diseases: Secondary | ICD-10-CM | POA: Diagnosis present

## 2019-03-05 DIAGNOSIS — O99213 Obesity complicating pregnancy, third trimester: Secondary | ICD-10-CM | POA: Diagnosis not present

## 2019-03-05 DIAGNOSIS — O113 Pre-existing hypertension with pre-eclampsia, third trimester: Principal | ICD-10-CM | POA: Diagnosis present

## 2019-03-05 DIAGNOSIS — Z3A29 29 weeks gestation of pregnancy: Secondary | ICD-10-CM | POA: Diagnosis not present

## 2019-03-05 DIAGNOSIS — O24419 Gestational diabetes mellitus in pregnancy, unspecified control: Secondary | ICD-10-CM | POA: Diagnosis not present

## 2019-03-05 DIAGNOSIS — Z87891 Personal history of nicotine dependence: Secondary | ICD-10-CM

## 2019-03-05 DIAGNOSIS — O163 Unspecified maternal hypertension, third trimester: Secondary | ICD-10-CM

## 2019-03-05 DIAGNOSIS — O10013 Pre-existing essential hypertension complicating pregnancy, third trimester: Secondary | ICD-10-CM | POA: Diagnosis not present

## 2019-03-05 DIAGNOSIS — O119 Pre-existing hypertension with pre-eclampsia, unspecified trimester: Secondary | ICD-10-CM | POA: Diagnosis present

## 2019-03-05 DIAGNOSIS — Z5329 Procedure and treatment not carried out because of patient's decision for other reasons: Secondary | ICD-10-CM | POA: Diagnosis present

## 2019-03-05 LAB — COMPREHENSIVE METABOLIC PANEL
ALT: 18 U/L (ref 0–44)
AST: 17 U/L (ref 15–41)
Albumin: 2.4 g/dL — ABNORMAL LOW (ref 3.5–5.0)
Alkaline Phosphatase: 72 U/L (ref 38–126)
Anion gap: 7 (ref 5–15)
BUN: 7 mg/dL (ref 6–20)
CO2: 25 mmol/L (ref 22–32)
Calcium: 9.1 mg/dL (ref 8.9–10.3)
Chloride: 105 mmol/L (ref 98–111)
Creatinine, Ser: 0.67 mg/dL (ref 0.44–1.00)
GFR calc Af Amer: >60 mL/min (ref >60)
GFR calc non Af Amer: >60 mL/min (ref >60)
Glucose, Bld: 105 mg/dL — ABNORMAL HIGH (ref 70–99)
Potassium: 3.9 mmol/L (ref 3.5–5.1)
Sodium: 137 mmol/L (ref 135–145)
Total Bilirubin: 0.3 mg/dL (ref 0.3–1.2)
Total Protein: 5.4 g/dL — ABNORMAL LOW (ref 6.5–8.1)

## 2019-03-05 LAB — CBC
HCT: 33.8 % — ABNORMAL LOW (ref 36.0–46.0)
Hemoglobin: 11.1 g/dL — ABNORMAL LOW (ref 12.0–15.0)
MCH: 28.6 pg (ref 26.0–34.0)
MCHC: 32.8 g/dL (ref 30.0–36.0)
MCV: 87.1 fL (ref 80.0–100.0)
Platelets: 210 10*3/uL (ref 150–400)
RBC: 3.88 MIL/uL (ref 3.87–5.11)
RDW: 13.7 % (ref 11.5–15.5)
WBC: 10.4 10*3/uL (ref 4.0–10.5)
nRBC: 0 % (ref 0.0–0.2)

## 2019-03-05 LAB — TYPE AND SCREEN
ABO/RH(D): A POS
Antibody Screen: NEGATIVE

## 2019-03-05 LAB — SARS CORONAVIRUS 2 BY RT PCR (HOSPITAL ORDER, PERFORMED IN ~~LOC~~ HOSPITAL LAB): SARS Coronavirus 2: NEGATIVE

## 2019-03-05 LAB — PROTEIN / CREATININE RATIO, URINE
Creatinine, Urine: 243.96 mg/dL
Protein Creatinine Ratio: 1.23 mg/mg{Cre} — ABNORMAL HIGH (ref 0.00–0.15)
Total Protein, Urine: 299 mg/dL

## 2019-03-05 LAB — ABO/RH: ABO/RH(D): A POS

## 2019-03-05 MED ORDER — LABETALOL HCL 5 MG/ML IV SOLN: 40.0000 mg | INTRAVENOUS | Status: DC | PRN

## 2019-03-05 MED ORDER — SODIUM CHLORIDE 0.9% FLUSH: 3.0000 mL | Freq: Two times a day (BID) | INTRAVENOUS | Status: DC

## 2019-03-05 MED ORDER — LABETALOL HCL 5 MG/ML IV SOLN: 20.0000 mg | INTRAVENOUS | Status: DC | PRN

## 2019-03-05 MED ORDER — LABETALOL HCL 5 MG/ML IV SOLN
20.0000 mg | INTRAVENOUS | Status: DC | PRN
  Administered 2019-03-05: 20 mg via INTRAVENOUS
  Filled 2019-03-05: qty 4

## 2019-03-05 MED ORDER — PRENATAL MULTIVITAMIN CH
1.0000 | ORAL_TABLET | Freq: Every day | ORAL | Status: DC
  Administered 2019-03-06 – 2019-03-07 (×2): 1 via ORAL
  Filled 2019-03-05 (×2): qty 1

## 2019-03-05 MED ORDER — CALCIUM CARBONATE ANTACID 500 MG PO CHEW: 2.0000 | CHEWABLE_TABLET | ORAL | Status: DC | PRN

## 2019-03-05 MED ORDER — SODIUM CHLORIDE 0.9% FLUSH: 3.0000 mL | INTRAVENOUS | Status: DC | PRN

## 2019-03-05 MED ORDER — ZOLPIDEM TARTRATE 5 MG PO TABS: 5.0000 mg | ORAL_TABLET | Freq: Every evening | ORAL | Status: DC | PRN

## 2019-03-05 MED ORDER — LABETALOL HCL 5 MG/ML IV SOLN: 80.0000 mg | INTRAVENOUS | Status: DC | PRN

## 2019-03-05 MED ORDER — DOCUSATE SODIUM 100 MG PO CAPS
100.0000 mg | ORAL_CAPSULE | Freq: Every day | ORAL | Status: DC
  Administered 2019-03-06: 100 mg via ORAL
  Filled 2019-03-05 (×2): qty 1

## 2019-03-05 MED ORDER — LABETALOL HCL 200 MG PO TABS
300.0000 mg | ORAL_TABLET | Freq: Two times a day (BID) | ORAL | Status: DC
  Administered 2019-03-05 – 2019-03-08 (×6): 300 mg via ORAL
  Filled 2019-03-05 (×6): qty 1

## 2019-03-05 MED ORDER — SODIUM CHLORIDE 0.9 % IV SOLN: 250.0000 mL | INTRAVENOUS | Status: DC | PRN

## 2019-03-05 MED ORDER — ACETAMINOPHEN 325 MG PO TABS: 650.0000 mg | ORAL_TABLET | ORAL | Status: DC | PRN

## 2019-03-05 MED ORDER — HYDRALAZINE HCL 20 MG/ML IJ SOLN: 10.0000 mg | INTRAMUSCULAR | Status: DC | PRN

## 2019-03-05 MED ORDER — BETAMETHASONE SOD PHOS & ACET 6 (3-3) MG/ML IJ SUSP
12.0000 mg | INTRAMUSCULAR | Status: AC
  Administered 2019-03-05 – 2019-03-06 (×2): 12 mg via INTRAMUSCULAR
  Filled 2019-03-05 (×3): qty 2

## 2019-03-05 NOTE — H&P (Addendum)
Lisa Sandoval is a 23 y.o. female presenting for elevated blood pressure  23 yo G1P0 @ 29+5 weeks presents from the office for evaluation of severe range blood pressures. THe patient has a history of chronic hypertension. She was started on medication at 27 weeks. She was seen in MAU on 9/23 for decreased fetal movement and was diagnosed with superimposed pre-eclampsia and had her labetalol increased to 200mg  BID. The new diagnosis was not communicated to a [facility] physician. The patient was advised to F/U in 48 hrs with our office, however due to a misunderstanding between the MAU provider and the patient this did not occur.   THe patient was seen in the office today for her 3 hr gtt and was noted to have severe range blood pressures and 3+ protein. After her 3 hr glucola she was sent to MAU for additional testing.    OB History    Gravida  2   Para      Term      Preterm      AB  1   Living  0     SAB  1   TAB      Ectopic      Multiple      Live Births  0          Past Medical History:  Diagnosis Date  . Pregnancy induced hypertension    Past Surgical History:  Procedure Laterality Date  . BACK SURGERY     Family History: family history includes Diabetes in her father and mother. Social History:  reports that she has quit smoking. Her smoking use included cigarettes. She has never used smokeless tobacco. She reports that she does not drink alcohol or use drugs.     Maternal Diabetes: No, elevated 1 hr gtt, completed 3 hr today Genetic Screening: Normal Maternal Ultrasounds/Referrals: Normal Fetal Ultrasounds or other Referrals:  None Maternal Substance Abuse:  No Significant Maternal Medications:  Labetalol, Prozac Significant Maternal Lab Results:  None Other Comments:  None   ROS History   Blood pressure (!) 149/82, pulse 75, temperature 98 F (36.7 C), temperature source Oral, height 5\' 4"  (1.626 m), weight 130.6 kg, last menstrual period  03/16/2018, SpO2 100 %. Exam Physical Exam  Prenatal labs: ABO, Rh:  A pos Antibody:  Neg Rubella:  Imm RPR:   NR HBsAg:   Neg HIV:   NR GBS:   too early  Results for orders placed or performed during the hospital encounter of 03/05/19 (from the past 24 hour(s))  Comprehensive metabolic panel     Status: Abnormal   Collection Time: 03/05/19  1:54 PM  Result Value Ref Range   Sodium 137 135 - 145 mmol/L   Potassium 3.9 3.5 - 5.1 mmol/L   Chloride 105 98 - 111 mmol/L   CO2 25 22 - 32 mmol/L   Glucose, Bld 105 (H) 70 - 99 mg/dL   BUN 7 6 - 20 mg/dL   Creatinine, Ser 0.67 0.44 - 1.00 mg/dL   Calcium 9.1 8.9 - 10.3 mg/dL   Total Protein 5.4 (L) 6.5 - 8.1 g/dL   Albumin 2.4 (L) 3.5 - 5.0 g/dL   AST 17 15 - 41 U/L   ALT 18 0 - 44 U/L   Alkaline Phosphatase 72 38 - 126 U/L   Total Bilirubin 0.3 0.3 - 1.2 mg/dL   GFR calc non Af Amer >60 >60 mL/min   GFR calc Af Amer >60 >60 mL/min  Anion gap 7 5 - 15  CBC     Status: Abnormal   Collection Time: 03/05/19  1:54 PM  Result Value Ref Range   WBC 10.4 4.0 - 10.5 K/uL   RBC 3.88 3.87 - 5.11 MIL/uL   Hemoglobin 11.1 (L) 12.0 - 15.0 g/dL   HCT 33.8 (L) 36.0 - 46.0 %   MCV 87.1 80.0 - 100.0 fL   MCH 28.6 26.0 - 34.0 pg   MCHC 32.8 30.0 - 36.0 g/dL   RDW 13.7 11.5 - 15.5 %   Platelets 210 150 - 400 K/uL   nRBC 0.0 0.0 - 0.2 %  Protein / creatinine ratio, urine     Status: Abnormal   Collection Time: 03/05/19  2:37 PM  Result Value Ref Range   Creatinine, Urine 243.96 mg/dL   Total Protein, Urine 299 mg/dL   Protein Creatinine Ratio 1.23 (H) 0.00 - 0.15 mg/mg[Cre]     Assessment/Plan: 1) Admit 2) Increase labetalol to 300mg  TID 3) BMZ for FLM 4) Continueous monitoring  5) 24 hr urine 6) MFM consult with [place] for EFW  Vanessa Kick 03/05/2019, 4:41 PM

## 2019-03-05 NOTE — MAU Provider Note (Signed)
Chief Complaint  Patient presents with  . Hypertension     First Provider Initiated Contact with Patient 03/05/19 1342      S: Lisa Sandoval  is a 23 y.o. y.o. year old G10P0010 female at [redacted]w[redacted]d weeks gestation who presents to MAU with elevated blood pressures in the office 99991111 systolic.  Hx CHTN. Current blood pressure medication: Labetalol 100 mg PO BID. Took one dose this morning.   Associated symptoms: Neg Headache, vision changes, epigastric pain Contractions: Denies Vaginal bleeding: Denies Fetal movement: Denies  O:  Patient Vitals for the past 24 hrs:  BP Temp Temp src Pulse Resp SpO2 Height Weight  03/05/19 1500 137/86 - - 81 - - - -  03/05/19 1446 (!) 164/106 - - 77 - - - -  03/05/19 1431 (!) 146/99 - - - - - - -  03/05/19 1401 (!) 157/89 - - 92 - - - -  03/05/19 1346 (!) 151/68 - - 88 - - - -  03/05/19 1331 (!) 159/81 - - 91 - - - -  03/05/19 1318 (!) 160/96 - - 84 - - - -  03/05/19 1316 (!) 160/96 98 F (36.7 C) Oral 81 - 100 % 5\' 4"  (1.626 m) 130.6 kg   General: NAD Heart: Regular rate Lungs: Normal rate and effort Abd: Soft, NT, Gravid, S=D Extremities: 3+ Pedal edema Neuro: 2+ deep tendon reflexes, No clonus Pelvic: Deferred      EFM: 135, Moderate variability, 15 x 15 accelerations, no decelerations Toco: None  Results for orders placed or performed during the hospital encounter of 03/05/19 (from the past 24 hour(s))  Comprehensive metabolic panel     Status: Abnormal   Collection Time: 03/05/19  1:54 PM  Result Value Ref Range   Sodium 137 135 - 145 mmol/L   Potassium 3.9 3.5 - 5.1 mmol/L   Chloride 105 98 - 111 mmol/L   CO2 25 22 - 32 mmol/L   Glucose, Bld 105 (H) 70 - 99 mg/dL   BUN 7 6 - 20 mg/dL   Creatinine, Ser 0.67 0.44 - 1.00 mg/dL   Calcium 9.1 8.9 - 10.3 mg/dL   Total Protein 5.4 (L) 6.5 - 8.1 g/dL   Albumin 2.4 (L) 3.5 - 5.0 g/dL   AST 17 15 - 41 U/L   ALT 18 0 - 44 U/L   Alkaline Phosphatase 72 38 - 126 U/L   Total Bilirubin 0.3  0.3 - 1.2 mg/dL   GFR calc non Af Amer >60 >60 mL/min   GFR calc Af Amer >60 >60 mL/min   Anion gap 7 5 - 15  CBC     Status: Abnormal   Collection Time: 03/05/19  1:54 PM  Result Value Ref Range   WBC 10.4 4.0 - 10.5 K/uL   RBC 3.88 3.87 - 5.11 MIL/uL   Hemoglobin 11.1 (L) 12.0 - 15.0 g/dL   HCT 33.8 (L) 36.0 - 46.0 %   MCV 87.1 80.0 - 100.0 fL   MCH 28.6 26.0 - 34.0 pg   MCHC 32.8 30.0 - 36.0 g/dL   RDW 13.7 11.5 - 15.5 %   Platelets 210 150 - 400 K/uL   nRBC 0.0 0.0 - 0.2 %  Protein / creatinine ratio, urine     Status: Abnormal   Collection Time: 03/05/19  2:37 PM  Result Value Ref Range   Creatinine, Urine 243.96 mg/dL   Total Protein, Urine 299 mg/dL   Protein Creatinine Ratio 1.23 (H) 0.00 -  0.15 mg/mg[Cre]  Type and screen [place]     Status: None   Collection Time: 03/05/19  5:07 PM  Result Value Ref Range   ABO/RH(D) A POS    Antibody Screen NEG    Sample Expiration      03/08/2019,2359 Performed at [facility], [place] [address] [place], [place] [address]   ABO/Rh     Status: None   Collection Time: 03/05/19  5:07 PM  Result Value Ref Range   ABO/RH(D)      A POS Performed at [facility] [address], [place], [place] [address]     A: [redacted]w[redacted]d week IUP Chronic hypertension/chronic hypertension with superimposed preeclampsia w/ severe features (BP) FHR reactive  P: Admit to [facility] per consult with Dr. Hulan Fray. Vanessa Kick, MD given report and assumes care of pt.  Tamala Julian, [place], [place] 03/05/2019 1:45 PM

## 2019-03-05 NOTE — Plan of Care (Signed)
Pt. Admitted from MAU with elevated BP. BP stable upon admit to [facility]. No complaints of pain, HA, blurry vision, vag bldg or drainage. Pt. Endorses good fetal movement. Pt. Oriented to room, calling for meals, expressing needs, and calling for any changes in conditions. Will monitor.

## 2019-03-05 NOTE — MAU Note (Signed)
Patient send from office for increase in BP. No pain, LOF, VB.

## 2019-03-06 LAB — CBC
HCT: 35.3 % — ABNORMAL LOW (ref 36.0–46.0)
Hemoglobin: 12 g/dL (ref 12.0–15.0)
MCH: 29.1 pg (ref 26.0–34.0)
MCHC: 34 g/dL (ref 30.0–36.0)
MCV: 85.5 fL (ref 80.0–100.0)
Platelets: 263 10*3/uL (ref 150–400)
RBC: 4.13 MIL/uL (ref 3.87–5.11)
RDW: 13.7 % (ref 11.5–15.5)
WBC: 17.9 10*3/uL — ABNORMAL HIGH (ref 4.0–10.5)
nRBC: 0 % (ref 0.0–0.2)

## 2019-03-06 LAB — CREATININE CLEARANCE, URINE, 24 HOUR
Collection Interval-CRCL: 24 hours
Creatinine Clearance: 163 mL/min — ABNORMAL HIGH (ref 75–115)
Creatinine, 24H Ur: 1713 mg/d (ref 600–1800)
Creatinine, Urine: 114.2 mg/dL
Urine Total Volume-CRCL: 1500 mL

## 2019-03-06 LAB — COMPREHENSIVE METABOLIC PANEL
ALT: 19 U/L (ref 0–44)
AST: 21 U/L (ref 15–41)
Albumin: 2.6 g/dL — ABNORMAL LOW (ref 3.5–5.0)
Alkaline Phosphatase: 70 U/L (ref 38–126)
Anion gap: 11 (ref 5–15)
BUN: 10 mg/dL (ref 6–20)
CO2: 19 mmol/L — ABNORMAL LOW (ref 22–32)
Calcium: 9.5 mg/dL (ref 8.9–10.3)
Chloride: 105 mmol/L (ref 98–111)
Creatinine, Ser: 0.73 mg/dL (ref 0.44–1.00)
GFR calc Af Amer: >60 mL/min (ref >60)
GFR calc non Af Amer: >60 mL/min (ref >60)
Glucose, Bld: 99 mg/dL (ref 70–99)
Potassium: 4.2 mmol/L (ref 3.5–5.1)
Sodium: 135 mmol/L (ref 135–145)
Total Bilirubin: 0.4 mg/dL (ref 0.3–1.2)
Total Protein: 6.1 g/dL — ABNORMAL LOW (ref 6.5–8.1)

## 2019-03-06 LAB — PROTEIN, URINE, 24 HOUR
Collection Interval-UPROT: 24 hours
Protein, 24H Urine: 2355 mg/d — ABNORMAL HIGH (ref 50–100)
Protein, Urine: 157 mg/dL
Urine Total Volume-UPROT: 1500 mL

## 2019-03-06 LAB — GLUCOSE, CAPILLARY: Glucose-Capillary: 103 mg/dL — ABNORMAL HIGH (ref 70–99)

## 2019-03-06 NOTE — Progress Notes (Signed)
Pt  Accidentally pulled her iv. Out in her sleep.

## 2019-03-06 NOTE — Progress Notes (Addendum)
OBGYN Note Lisa Sandoval 23 y.o. G2P0010 at [redacted]w[redacted]d with superimposed preeclampsia S: Doing well, denies SOB/RUQ pain/HA/VC. O:  Vitals:   03/05/19 1935 03/05/19 2336 03/06/19 0538 03/06/19 0802  BP: (!) 148/93 136/73 138/83 139/78  Pulse: 76 76 92 90  Resp: 18 18 18 16   Temp: 98.3 F (36.8 C) 98.4 F (36.9 C) 98.2 F (36.8 C) 98.2 F (36.8 C)  TempSrc: Oral Oral Oral Oral  SpO2: 100% 100%  99%  Weight:      Height:       UOP 50cc/h  RRR, CTAB, DTR+2   NST: FHR 130, +accels, no decels. Reassuring. Toco quiet.   Recent Labs    03/05/19 1354  WBC 10.4  HGB 11.1*  HCT 33.8*  PLT 210  NA 137  K 3.9  CL 105  BUN 7  CREATININE 0.67  AST 17  ALT 18  BILITOT 0.3   24h urine pending  A/P: [name] 22 y.o. G2P0010 at [redacted]w[redacted]d with superimposed preeclampsia 1. Superimposed preeclampsia: CHTN, was on labetalol 200mg  BID, seen in MAU last week with elevated Bps and decreased FM, found to have UPC 0.74, diagnosed with superimposed preeclampsia. Admitted yesterday for elevated BP, required 1 dose of IV labetalol 20mg , increased labetalol to 300mg  BID 9/29. Asymptomatic, CBC/CMP wnl.  -24h urine pending -Repeat CBC/CMP today -Monitor Bps -Consider discharge today or tomorrow if stable and pending MFM recommendations 2. Prematurity: BMZ 9/29-9/30 1700. MFM [place] for growth completed yesterday, waiting on final report 3. Fetal testing: Has been on continuous monitoring, reassuring, will plan for daily NST now 4. Failed 1h, passed 3h gtt.   [facility] 03/06/19 8:17 AM

## 2019-03-06 NOTE — Progress Notes (Addendum)
OBGYN Note Spoke with MFM, will monitor Bps overnight, they will see them in the AM and consider DC tomorrow.  BP: Vitals:   03/06/19 0811 03/06/19 0941 03/06/19 1352 03/06/19 1607  BP: (!) 150/88 135/85 (!) 146/76 (!) 146/71  Pulse: (!) 112 94 83 81  Resp: 18  18 18   Temp: 98.2 F (36.8 C)  98.1 F (36.7 C) 98.3 F (36.8 C)  TempSrc: Oral  Oral Oral  SpO2: 99%  99% 99%  Weight:      Height:       Recent Labs    03/05/19 1354 03/06/19 0819  WBC 10.4 17.9*  HGB 11.1* 12.0  HCT 33.8* 35.3*  PLT 210 263  NA 137 135  K 3.9 4.2  CL 105 105  BUN 7 10  CREATININE 0.67 0.73  AST 17 21  ALT 18 19  BILITOT 0.3 0.4   Cont to monitor Bps, symptoms of preeclampsia, daily NST.  Final read of [place] pending 24h urine pending Lisa Sandoval 03/06/19 7:24 PM

## 2019-03-07 DIAGNOSIS — O113 Pre-existing hypertension with pre-eclampsia, third trimester: Secondary | ICD-10-CM | POA: Diagnosis present

## 2019-03-07 DIAGNOSIS — Z20828 Contact with and (suspected) exposure to other viral communicable diseases: Secondary | ICD-10-CM | POA: Diagnosis present

## 2019-03-07 DIAGNOSIS — Z3A29 29 weeks gestation of pregnancy: Secondary | ICD-10-CM | POA: Diagnosis not present

## 2019-03-07 DIAGNOSIS — O10013 Pre-existing essential hypertension complicating pregnancy, third trimester: Secondary | ICD-10-CM | POA: Diagnosis present

## 2019-03-07 DIAGNOSIS — Z87891 Personal history of nicotine dependence: Secondary | ICD-10-CM | POA: Diagnosis not present

## 2019-03-07 DIAGNOSIS — R030 Elevated blood-pressure reading, without diagnosis of hypertension: Secondary | ICD-10-CM | POA: Diagnosis not present

## 2019-03-07 DIAGNOSIS — Z5329 Procedure and treatment not carried out because of patient's decision for other reasons: Secondary | ICD-10-CM | POA: Diagnosis present

## 2019-03-07 MED ORDER — LABETALOL HCL 300 MG PO TABS: 300.0000 mg | ORAL_TABLET | Freq: Two times a day (BID) | ORAL | 3 refills | Status: DC

## 2019-03-07 NOTE — Progress Notes (Signed)
Pt has no complaints, Good FM. B/P - slightly elevated on labetalol 300 BID IMP/ Stable Plan/Awaiting MFM consult

## 2019-03-07 NOTE — Progress Notes (Signed)
MFM Consult  This patient was seen in consultation due to chronic hypertension with superimposed preeclampsia.  She is currently 29 weeks and 4 days pregnant. The patient was admitted to the hospital 2 days ago due to elevated blood pressures.  She is currently being treated with labetalol 300 mg twice a day for blood pressure control.  Her blood pressures in the hospital have ranged in the 130s to 150s over 70s to 80s.  The patient denies any symptoms related to preeclampsia and reports feeling fetal movements throughout the day.  Her fetal testing has been reassuring.  Her [place] labs are all within normal limits.  Her 24-hour urine showed a total protein of 2355 mg.  The patient had an ultrasound performed a few days ago that showed appropriate fetal growth.  The EFW was 3 pounds 3 ounces (56 percentile for her gestational age).  There was normal amniotic fluid noted.  The implications and management of chronic hypertension with superimposed preeclampsia was discussed with the patient.  She was advised that preeclampsia can affect both the mother and the fetus.  In the mother, preeclampsia may cause a rise in blood pressures and it can affect the mother's kidney, liver and platelet functions.  It may also cause the mother to have seizures.  In the fetus, it may cause growth restriction and oligohydramnios.  She understands that delivery is the only treatment for preeclampsia.  As the patient is currently asymptomatic, her blood pressures are moderately controlled using labetalol, and the fetal testing is reassuring, outpatient management with modified home bedrest may be considered.  The patient should be continued on labetalol as an outpatient.  She should be seen in the office twice a week for fetal testing and blood pressure checks.  Her amniotic fluid levels and PIH labs should also be checked on a weekly basis.  The goal for delivery should be at around 34-36 weeks depending on her clinical status.  The  patient understands that an earlier delivery will be indicated should she show any signs or symptoms of preeclampsia, should her blood pressures be persistently elevated above the 160/100's range despite medication treatment,  should she require increasing doses of blood pressure medication to control her blood pressures, should there be nonreassuring fetal status, should her PIH labs show any abnormalities, or should there sonographic signs of fetal growth restriction or oligohydramnios.  The signs and symptoms of preeclampsia (headache, seeing spots in front of her eyes, right upper quadrant pain) were reviewed with the patient today.  She will continue to monitor her blood pressures at home with a home blood pressure cuff on a daily basis.  She was advised to call should her blood pressures be persistently greater than 150/90.  She should have a follow-up growth scan performed in 3 weeks.  Due to preeclampsia, the patient should receive magnesium sulfate for maternal seizure prophylaxis at the time of delivery.  A rescue course of antenatal corticosteroids should be given should she require delivery prior to 34 weeks and it has been 7 days since she received her initial course.  At the end of the consultation, the patient stated that all of her questions have been answered to her complete satisfaction.    Thank you for referring this very nice patient for a Maternal-Fetal medicine Consultation.    Recommendations  -Continue labetalol for blood pressure control -Modified home bedrest -The patient will continue to monitor her blood pressures at home -Twice weekly fetal testing in the office -Weekly  PIH labs -Weekly amniotic fluid checks -Delivery at 34 to 36 weeks depending on clinical status -Delivery prior to 34 weeks should her blood pressures be persistently greater than 160/100, should she show any signs or symptoms of severe preeclampsia, should her PIH labs show any abnormalities, or for  nonreassuring fetal status -Rescue course of steroids as indicated -Magnesium sulfate for maternal seizure prophylaxis at the time of delivery

## 2019-03-07 NOTE — Discharge Summary (Signed)
  Pt is a 23 yr old white female who was admitted on 03/05/19 for elevated B/Ps. She did have the dx of chronic HTN and was currently on Labetalol 200mg  BID. In the office she did have B/Ps in the severe range.   She was admitted and monitored. Her Labetalol was increased to 300mg  BID. B/P s remained slightly elevated, None in the severe range. Her LFTs/Plts were nl and remained nl. Her Pr/ Cr was elevated. She had a consult and u/s performed by MFM. The u/s had nl growth and fluid.   Given that the pt remained stable she will be discharged to home and the recommendations of MFM will be followed. Pt will check her B/P QID and call if elevated. She will continue labetalol 300 BID. She will call with a headache, vision changes, decreased FM, RUQ pain

## 2019-03-08 MED ORDER — LABETALOL HCL 300 MG PO TABS: 300.0000 mg | ORAL_TABLET | Freq: Three times a day (TID) | ORAL | 1 refills | Status: DC

## 2019-03-08 MED FILL — LABETALOL HCL 300 MG TABS: 300 | 30 days supply | Qty: 90 | Fill #0

## 2019-03-08 NOTE — Progress Notes (Signed)
TC from [facility].  Patient has declined to stay in hospital for any additional fetal or BP monitoring.  She states that she wants to attend her baby shower this evening.  We previously discussed risks of COVID and pregnancy and that I strongly recommend that she not attend an in person baby shower, both due to risks of covid as well as her current hospitalization for preeclampsia, with again worsening blood pressures.    She declines to stay at this time and is actively dressing to leave per RN.    BP (!) 164/99   Pulse (!) 59   Temp 97.6 F (36.4 C) (Oral)   Resp 18   Ht 5\' 4"  (1.626 m)   Wt 130.6 kg   LMP 03/16/2018   SpO2 100%   BMI 49.44 kg/m   I had a telephone conversation with the patient. She currently has a severe range BP.  I again recommend that she stay for inpatient monitoring.  She declines  I will increase her labetalol to 300mg  TID.  I strongly encourage her to check her BP at home 2-3x/day at minimal and call if it is persistently > 150/100.  I reviewed the symptoms of preeclampsia.  We will arrange twice weekly antenatal testing in the office next Monday / Thursday, but encouraged Tonja to call sooner if there are any worsening signs / symptoms.  She is aware that she is leaving the hospital against our medical advice

## 2019-03-08 NOTE — Progress Notes (Addendum)
0915 patient informed RN that she would like to sign the AMA papers and leave against medical advice. Dr. Carlis Abbott notified, requested one final BP reading, and spoke with pt on telephone. BP 164/99, MD and pt aware. Pt left unit after signing [place] papers. Risks of leaving and reasons to seek care discussed; pt verbalized understanding.

## 2019-03-08 NOTE — Progress Notes (Signed)
OBGYN Note Lisa Sandoval 23 y.o. G2P0010 at [redacted]w[redacted]d with superimposed preeclampsia  S: Yesterday, her blood pressures were running 130-140s/80s.  Plan was for discharge, however there was a 3 minute deceleration noted on EFM, so she was kept for further observations.    Overnight, her blood pressures have started to increase again to the [id number] systolic.  Fetal monitoring has been reassuring.  Today, she is doing well, denies SOB/RUQ pain/HA/ BV.   She is anxious to leave today as she has a baby shower planned for this evening.   O:  Vitals:   03/08/19 0618 03/08/19 0653 03/08/19 0750 03/08/19 0808  BP: (!) 151/80 (!) 147/83  (!) 159/91  Pulse: 79 (!) 55  (!) 55  Resp: 17 18  18   Temp: 97.6 F (36.4 C)   97.6 F (36.4 C)  TempSrc: Oral   Oral  SpO2: 100%  98% 100%  Weight:      Height:        NST: FHR 130, +accels, no decels. Reassuring. Toco quiet.   Recent Labs    03/05/19 1354 03/06/19 0819  WBC 10.4 17.9*  HGB 11.1* 12.0  HCT 33.8* 35.3*  PLT 210 263  NA 137 135  K 3.9 4.2  CL 105 105  BUN 7 10  CREATININE 0.67 0.73  AST 17 21  ALT 18 19  BILITOT 0.3 0.4   24h urine pending  A/P: Lisa Sandoval 22 y.o. G2P0010 at [redacted]w[redacted]d with superimposed preeclampsia 1. Superimposed preeclampsia: Labetalol has been increased to 300mg  BID.   MFM agreed to discharge with out patient management yesterday prior to fetal deceleration. In the interim, BPs have trended up over the last 12 hours.  I recommend serial blood pressure monitoring today with potential titration of anti-hypertensives to goal < 150 /100.   Per MFM--plan after discharge:   Continue labetalol for blood pressure control -Modified home bedrest -The patient will continue to monitor her blood pressures at home -Twice weekly fetal testing in the office -Weekly PIH labs -Weekly amniotic fluid checks -Delivery at 34 to 36 weeks depending on clinical status -Delivery prior to 34 weeks should her blood pressures be  persistently greater than 160/100, should she show any signs or symptoms of severe preeclampsia, should her PIH labs show any abnormalities, or for nonreassuring fetal status -Rescue course of steroids as indicated -Magnesium sulfate for maternal seizure prophylaxis at the time of delivery  2. Prematurity: BMZ 9/29-9/30 1700. 3. Fetal testing: Continuous monitoring today, reassuring since fetal deceleration yesterday 4. Failed 1h, passed 3h gtt.   The patient is very anxious to leave today.  She has an in person baby shower this evening that she wants to finishing planning and attend.  We had a long discussion regarding risks of preeclampsia, including but not limited to seizure, stroke, liver or renal failure, placental abruption leading to fetal death.  We will monitor her BPs and fetal status this morning and reassess.   [place] [facility] 03/08/19 9:21 AM

## 2019-03-10 ENCOUNTER — Other Ambulatory Visit: Payer: Self-pay

## 2019-03-10 ENCOUNTER — Inpatient Hospital Stay (HOSPITAL_COMMUNITY)
Admission: AD | Admit: 2019-03-10 | Discharge: 2019-03-17 | DRG: 786 | Disposition: A | Discharge status: Home / Self Care | Payer: Medicaid Other | Attending: Obstetrics and Gynecology | Admitting: Obstetrics and Gynecology

## 2019-03-10 ENCOUNTER — Encounter (HOSPITAL_COMMUNITY): Payer: Self-pay

## 2019-03-10 DIAGNOSIS — O119 Pre-existing hypertension with pre-eclampsia, unspecified trimester: Secondary | ICD-10-CM

## 2019-03-10 DIAGNOSIS — O113 Pre-existing hypertension with pre-eclampsia, third trimester: Secondary | ICD-10-CM

## 2019-03-10 DIAGNOSIS — O99214 Obesity complicating childbirth: Secondary | ICD-10-CM | POA: Diagnosis present

## 2019-03-10 DIAGNOSIS — Z20828 Contact with and (suspected) exposure to other viral communicable diseases: Secondary | ICD-10-CM | POA: Diagnosis present

## 2019-03-10 DIAGNOSIS — Z87891 Personal history of nicotine dependence: Secondary | ICD-10-CM

## 2019-03-10 DIAGNOSIS — O9A213 Injury, poisoning and certain other consequences of external causes complicating pregnancy, third trimester: Secondary | ICD-10-CM | POA: Diagnosis present

## 2019-03-10 DIAGNOSIS — O1002 Pre-existing essential hypertension complicating childbirth: Secondary | ICD-10-CM | POA: Diagnosis present

## 2019-03-10 DIAGNOSIS — I6201 Nontraumatic acute subdural hemorrhage: Secondary | ICD-10-CM | POA: Diagnosis present

## 2019-03-10 DIAGNOSIS — O114 Pre-existing hypertension with pre-eclampsia, complicating childbirth: Principal | ICD-10-CM | POA: Diagnosis present

## 2019-03-10 DIAGNOSIS — Z3A30 30 weeks gestation of pregnancy: Secondary | ICD-10-CM

## 2019-03-10 HISTORY — DX: Pre-existing essential hypertension complicating pregnancy, third trimester: O10.013

## 2019-03-10 LAB — URINALYSIS, ROUTINE W REFLEX MICROSCOPIC
Bilirubin Urine: NEGATIVE
Glucose, UA: NEGATIVE mg/dL
Ketones, ur: NEGATIVE mg/dL
Leukocytes,Ua: NEGATIVE
Nitrite: NEGATIVE
Protein, ur: >=300 mg/dL — AB
Specific Gravity, Urine: 1.028 (ref 1.005–1.030)
pH: 6 (ref 5.0–8.0)

## 2019-03-10 LAB — CBC WITH DIFFERENTIAL/PLATELET
Abs Immature Granulocytes: 0.49 10*3/uL — ABNORMAL HIGH (ref 0.00–0.07)
Basophils Absolute: 0.1 10*3/uL (ref 0.0–0.1)
Basophils Relative: 0 %
Eosinophils Absolute: 0.4 10*3/uL (ref 0.0–0.5)
Eosinophils Relative: 2 %
HCT: 33.3 % — ABNORMAL LOW (ref 36.0–46.0)
Hemoglobin: 11.4 g/dL — ABNORMAL LOW (ref 12.0–15.0)
Immature Granulocytes: 3 %
Lymphocytes Relative: 14 %
Lymphs Abs: 2.4 10*3/uL (ref 0.7–4.0)
MCH: 29.4 pg (ref 26.0–34.0)
MCHC: 34.2 g/dL (ref 30.0–36.0)
MCV: 85.8 fL (ref 80.0–100.0)
Monocytes Absolute: 1.6 10*3/uL — ABNORMAL HIGH (ref 0.1–1.0)
Monocytes Relative: 10 %
Neutro Abs: 12 10*3/uL — ABNORMAL HIGH (ref 1.7–7.7)
Neutrophils Relative %: 71 %
Platelets: 223 10*3/uL (ref 150–400)
RBC: 3.88 MIL/uL (ref 3.87–5.11)
RDW: 13.8 % (ref 11.5–15.5)
WBC: 16.9 10*3/uL — ABNORMAL HIGH (ref 4.0–10.5)
nRBC: 0.1 % (ref 0.0–0.2)

## 2019-03-10 LAB — COMPREHENSIVE METABOLIC PANEL
ALT: 28 U/L (ref 0–44)
AST: 23 U/L (ref 15–41)
Albumin: 2.2 g/dL — ABNORMAL LOW (ref 3.5–5.0)
Alkaline Phosphatase: 72 U/L (ref 38–126)
Anion gap: 9 (ref 5–15)
BUN: 27 mg/dL — ABNORMAL HIGH (ref 6–20)
CO2: 20 mmol/L — ABNORMAL LOW (ref 22–32)
Calcium: 9.8 mg/dL (ref 8.9–10.3)
Chloride: 107 mmol/L (ref 98–111)
Creatinine, Ser: 0.98 mg/dL (ref 0.44–1.00)
GFR calc Af Amer: >60 mL/min (ref >60)
GFR calc non Af Amer: >60 mL/min (ref >60)
Glucose, Bld: 91 mg/dL (ref 70–99)
Potassium: 4.4 mmol/L (ref 3.5–5.1)
Sodium: 136 mmol/L (ref 135–145)
Total Bilirubin: 0.3 mg/dL (ref 0.3–1.2)
Total Protein: 5.3 g/dL — ABNORMAL LOW (ref 6.5–8.1)

## 2019-03-10 MED ORDER — LABETALOL HCL 5 MG/ML IV SOLN
40.0000 mg | INTRAVENOUS | Status: DC | PRN
  Administered 2019-03-11 – 2019-03-14 (×4): 40 mg via INTRAVENOUS
  Filled 2019-03-10 (×4): qty 8

## 2019-03-10 MED ORDER — ACETAMINOPHEN 500 MG PO TABS
1000.0000 mg | ORAL_TABLET | Freq: Once | ORAL | Status: AC
  Administered 2019-03-10: 1000 mg via ORAL
  Filled 2019-03-10: qty 2

## 2019-03-10 MED ORDER — LABETALOL HCL 5 MG/ML IV SOLN
80.0000 mg | INTRAVENOUS | Status: DC | PRN
  Administered 2019-03-11 – 2019-03-14 (×3): 80 mg via INTRAVENOUS
  Filled 2019-03-10 (×3): qty 16

## 2019-03-10 MED ORDER — HYDRALAZINE HCL 20 MG/ML IJ SOLN
10.0000 mg | INTRAMUSCULAR | Status: DC | PRN
  Administered 2019-03-11 – 2019-03-14 (×3): 10 mg via INTRAVENOUS
  Filled 2019-03-10 (×4): qty 1

## 2019-03-10 MED ORDER — NIFEDIPINE 10 MG PO CAPS
10.0000 mg | ORAL_CAPSULE | Freq: Once | ORAL | Status: AC
  Administered 2019-03-10: 10 mg via ORAL
  Filled 2019-03-10: qty 1

## 2019-03-10 MED ORDER — LABETALOL HCL 5 MG/ML IV SOLN
20.0000 mg | INTRAVENOUS | Status: DC | PRN
  Administered 2019-03-10 – 2019-03-14 (×4): 20 mg via INTRAVENOUS
  Filled 2019-03-10 (×4): qty 4

## 2019-03-10 NOTE — MAU Note (Signed)
IV team at bedside 

## 2019-03-10 NOTE — MAU Note (Signed)
Pt here for HBP. Took BP at home and it was 199/119. Pt is on 300mg  labetalol q8 hours. Last dose at 5pm. Pt reports a HA x2 days. Took Tylenol at 12pm, and HA is still there. Says it is "more annoying than anything." Rates 5/10. Denies vision changes or RUQ pain. Reports good fetal movement. Reports some braxton hicks contractions, but denies LOF or vaginal bleeding.

## 2019-03-10 NOTE — MAU Note (Signed)
Unable to obtain IV site. IV team called.

## 2019-03-10 NOTE — MAU Provider Note (Signed)
History     CSN: [id number]  Arrival date and time: 03/10/19 2134   First Provider Initiated Contact with Patient 03/10/19 2252      Chief Complaint  Patient presents with  . Hypertension  . Headache   HPI IRERI NEWBROUGH is a 23 y.o. G2P0010 at [redacted]w[redacted]d who presents to MAU with chief complaint of headache and severe range blood pressures on her home cuff in the setting of previously diagnosed Chronic Hypertension with Superimposed Preeclampsia. She manages her blood pressure with 300 mg Labetalol TID. She states she woke up late and did not take her second dose until 5pm.  Headache This is a recurrent problem, current episode began two days ago and has been constant since that time. Patient reports pain score of 5/10. Her pain is located at her occiput and does not radiate. She took 500 mg of Tylenol around 1pm today but did not experience relief.  Patient denies visual disturbances, RUQ/epigastric pain, new onset swelling or weight gain. She denies vaginal bleeding, leaking of fluid, decreased fetal movement, fever, falls, or recent illness.   Patient is s/p Antepartum admission on 03/05/19. She left AMA on 03/08/19.  OB History    Gravida  2   Para      Term      Preterm      AB  1   Living  0     SAB  1   TAB      Ectopic      Multiple      Live Births  0           Past Medical History:  Diagnosis Date  . Pregnancy induced hypertension     Past Surgical History:  Procedure Laterality Date  . BACK SURGERY      Family History  Problem Relation Age of Onset  . Diabetes Mother   . Diabetes Father     Social History   Tobacco Use  . Smoking status: Former Smoker    Types: Cigarettes  . Smokeless tobacco: Never Used  Substance Use Topics  . Alcohol use: No  . Drug use: No    Allergies:  Allergies  Allergen Reactions  . Sulfa Antibiotics Hives    Medications Prior to Admission  Medication Sig Dispense Refill Last Dose  . aspirin EC  81 MG tablet Take 81 mg by mouth daily.   03/10/2019 at Unknown time  . labetalol (NORMODYNE) 300 MG tablet Take 1 tablet (300 mg total) by mouth 3 (three) times daily. 90 tablet 1 03/10/2019 at Unknown time  . prenatal vitamin w/FE, FA (PRENATAL 1 + 1) 27-1 MG TABS tablet Take 1 tablet by mouth daily at 12 noon.   03/10/2019 at Unknown time    Review of Systems  Constitutional: Negative for chills, fatigue and fever.  Eyes: Negative for visual disturbance.  Respiratory: Negative for shortness of breath.   Cardiovascular: Negative for palpitations and leg swelling.  Gastrointestinal: Negative for abdominal pain.  Genitourinary: Negative for vaginal bleeding, vaginal discharge and vaginal pain.  Musculoskeletal: Negative for back pain.  Neurological: Positive for headaches. Negative for dizziness, syncope and weakness.  All other systems reviewed and are negative.  Physical Exam   Blood pressure (!) 194/100, pulse 69, temperature 98.2 F (36.8 C), temperature source Oral, resp. rate 20, height 5\' 4"  (1.626 m), weight 135.1 kg, last menstrual period 03/16/2018, SpO2 98 %.  Physical Exam  Nursing note and vitals reviewed. Constitutional: She is oriented to  person, place, and time. She appears well-developed and well-nourished.  Cardiovascular: Normal rate.  Respiratory: Effort normal and breath sounds normal.  GI: She exhibits no distension. There is no abdominal tenderness. There is no rebound, no guarding and no CVA tenderness.  Musculoskeletal: Normal range of motion.  Neurological: She is alert and oriented to person, place, and time. She has normal strength. She is not disoriented. No sensory deficit.  Skin: Skin is warm and dry.  Psychiatric: She has a normal mood and affect. Her behavior is normal. Judgment and thought content normal.  Patient is well-appearing and jovial. No periorbital or facial swelling observed  MAU Course/MDM  Procedures  --2236 hours: Given recent Ante  admission, I called Dr. Carlis Abbott at 2236 to notify her of patient arrival and severe range initial BP. Per Dr. Carlis Abbott, P:Cr not re-accomplished --0026 hours: Update provided to Dr. Carlis Abbott at [phone]. Patient's headache resolved, pain score of 0/10 following PO Tylenol. Fetal tracing Category II. Given Procardia PO for initial Severe Range BP intervention due to difficulty finding IV access. Per Dr. Carlis Abbott, patient given 300 mg PO Labetalol in conjunction with IV 80 mg. --0102: Update given to Dr. Carlis Abbott. Patient completed Preeclampsia protocol, continues to deny headache, severe blood pressure resolved for one interval  Patient Vitals for the past 24 hrs:  BP Temp Temp src Pulse Resp SpO2 Height Weight  03/11/19 0135 (!) 158/96 - - 84 - - - -  03/11/19 0116 (!) 177/94 - - 78 - - - -  03/11/19 0101 (!) 141/78 - - 98 - - - -  03/11/19 0050 (!) 168/89 - - 77 - - - -  03/11/19 0040 (!) 177/95 - - 68 - - - -  03/11/19 0031 (!) 183/105 - - 70 - - - -  03/11/19 0022 (!) 178/103 - - 62 - - - -  03/11/19 0010 (!) 184/106 - - (!) 59 - - - -  03/11/19 0005 (!) 173/101 - - 63 - - - -  03/11/19 0002 (!) 179/97 - - 60 - - - -  03/10/19 2355 (!) 198/92 - - 61 - - - -  03/10/19 2316 (!) 210/136 - - 61 - - - -  03/10/19 2238 (!) 194/100 - - 69 - - - -  03/10/19 2231 (!) 189/124 98.2 F (36.8 C) Oral 67 20 98 % - -  03/10/19 2221 - - - - - - 5\' 4"  (1.626 m) 135.1 kg   Results for orders placed or performed during the hospital encounter of 03/10/19 (from the past 24 hour(s))  Urinalysis, Routine w reflex microscopic     Status: Abnormal   Collection Time: 03/10/19 10:37 PM  Result Value Ref Range   Color, Urine YELLOW YELLOW   APPearance HAZY (A) CLEAR   Specific Gravity, Urine 1.028 1.005 - 1.030   pH 6.0 5.0 - 8.0   Glucose, UA NEGATIVE NEGATIVE mg/dL   Hgb urine dipstick SMALL (A) NEGATIVE   Bilirubin Urine NEGATIVE NEGATIVE   Ketones, ur NEGATIVE NEGATIVE mg/dL   Protein, ur >=300 (A) NEGATIVE mg/dL    Nitrite NEGATIVE NEGATIVE   Leukocytes,Ua NEGATIVE NEGATIVE   RBC / HPF 6-10 0 - 5 RBC/hpf   WBC, UA 11-20 0 - 5 WBC/hpf   Bacteria, UA RARE (A) NONE SEEN   Squamous Epithelial / LPF 6-10 0 - 5   Mucus PRESENT    Hyaline Casts, UA PRESENT   CBC with Differential/Platelet     Status:  Abnormal   Collection Time: 03/10/19 10:55 PM  Result Value Ref Range   WBC 16.9 (H) 4.0 - 10.5 K/uL   RBC 3.88 3.87 - 5.11 MIL/uL   Hemoglobin 11.4 (L) 12.0 - 15.0 g/dL   HCT 33.3 (L) 36.0 - 46.0 %   MCV 85.8 80.0 - 100.0 fL   MCH 29.4 26.0 - 34.0 pg   MCHC 34.2 30.0 - 36.0 g/dL   RDW 13.8 11.5 - 15.5 %   Platelets 223 150 - 400 K/uL   nRBC 0.1 0.0 - 0.2 %   Neutrophils Relative % 71 %   Neutro Abs 12.0 (H) 1.7 - 7.7 K/uL   Lymphocytes Relative 14 %   Lymphs Abs 2.4 0.7 - 4.0 K/uL   Monocytes Relative 10 %   Monocytes Absolute 1.6 (H) 0.1 - 1.0 K/uL   Eosinophils Relative 2 %   Eosinophils Absolute 0.4 0.0 - 0.5 K/uL   Basophils Relative 0 %   Basophils Absolute 0.1 0.0 - 0.1 K/uL   Immature Granulocytes 3 %   Abs Immature Granulocytes 0.49 (H) 0.00 - 0.07 K/uL  Comprehensive metabolic panel     Status: Abnormal   Collection Time: 03/10/19 10:55 PM  Result Value Ref Range   Sodium 136 135 - 145 mmol/L   Potassium 4.4 3.5 - 5.1 mmol/L   Chloride 107 98 - 111 mmol/L   CO2 20 (L) 22 - 32 mmol/L   Glucose, Bld 91 70 - 99 mg/dL   BUN 27 (H) 6 - 20 mg/dL   Creatinine, Ser 0.98 0.44 - 1.00 mg/dL   Calcium 9.8 8.9 - 10.3 mg/dL   Total Protein 5.3 (L) 6.5 - 8.1 g/dL   Albumin 2.2 (L) 3.5 - 5.0 g/dL   AST 23 15 - 41 U/L   ALT 28 0 - 44 U/L   Alkaline Phosphatase 72 38 - 126 U/L   Total Bilirubin 0.3 0.3 - 1.2 mg/dL   GFR calc non Af Amer >60 >60 mL/min   GFR calc Af Amer >60 >60 mL/min   Anion gap 9 5 - 15   Meds ordered this encounter  Medications  . AND Linked Order Group   . labetalol (NORMODYNE) injection 20 mg   . labetalol (NORMODYNE) injection 40 mg   . labetalol (NORMODYNE)  injection 80 mg   . hydrALAZINE (APRESOLINE) injection 10 mg  . acetaminophen (TYLENOL) tablet 1,000 mg  . NIFEdipine (PROCARDIA) capsule 10 mg  . labetalol (NORMODYNE) tablet 300 mg   Assessment and Plan  --23 y.o. G2P0010 at [redacted]w[redacted]d Chronic HTN with Superimposed Preeclampsia --Headache resolved with Tylenol --Severe range blood pressures responsive to Preeclampsia Protocol --Category II tracing, now resolved --Per Dr. Carlis Abbott, admit to [place], continue serial blood pressures and continuous monitoring  Darlina Rumpf, CNM 03/11/2019, 1:45 AM

## 2019-03-11 ENCOUNTER — Encounter (HOSPITAL_COMMUNITY): Payer: Self-pay

## 2019-03-11 ENCOUNTER — Inpatient Hospital Stay (HOSPITAL_COMMUNITY): Payer: Medicaid Other | Admitting: Certified Registered Nurse Anesthetist

## 2019-03-11 ENCOUNTER — Encounter (HOSPITAL_COMMUNITY)
Admission: AD | Disposition: A | Discharge status: Home / Self Care | Payer: Self-pay | Source: Home / Self Care | Attending: Obstetrics and Gynecology

## 2019-03-11 DIAGNOSIS — O9A213 Injury, poisoning and certain other consequences of external causes complicating pregnancy, third trimester: Secondary | ICD-10-CM | POA: Diagnosis present

## 2019-03-11 DIAGNOSIS — I6201 Nontraumatic acute subdural hemorrhage: Secondary | ICD-10-CM | POA: Diagnosis present

## 2019-03-11 DIAGNOSIS — O114 Pre-existing hypertension with pre-eclampsia, complicating childbirth: Secondary | ICD-10-CM | POA: Diagnosis present

## 2019-03-11 DIAGNOSIS — Z20828 Contact with and (suspected) exposure to other viral communicable diseases: Secondary | ICD-10-CM | POA: Diagnosis present

## 2019-03-11 DIAGNOSIS — Z3A30 30 weeks gestation of pregnancy: Secondary | ICD-10-CM | POA: Diagnosis not present

## 2019-03-11 DIAGNOSIS — O99214 Obesity complicating childbirth: Secondary | ICD-10-CM | POA: Diagnosis present

## 2019-03-11 DIAGNOSIS — O1002 Pre-existing essential hypertension complicating childbirth: Secondary | ICD-10-CM | POA: Diagnosis present

## 2019-03-11 DIAGNOSIS — O113 Pre-existing hypertension with pre-eclampsia, third trimester: Secondary | ICD-10-CM | POA: Diagnosis not present

## 2019-03-11 DIAGNOSIS — Z87891 Personal history of nicotine dependence: Secondary | ICD-10-CM | POA: Diagnosis not present

## 2019-03-11 DIAGNOSIS — O119 Pre-existing hypertension with pre-eclampsia, unspecified trimester: Secondary | ICD-10-CM | POA: Diagnosis present

## 2019-03-11 LAB — CBC
HCT: 36.4 % (ref 36.0–46.0)
Hemoglobin: 11.8 g/dL — ABNORMAL LOW (ref 12.0–15.0)
MCH: 28.3 pg (ref 26.0–34.0)
MCHC: 32.4 g/dL (ref 30.0–36.0)
MCV: 87.3 fL (ref 80.0–100.0)
Platelets: 222 10*3/uL (ref 150–400)
RBC: 4.17 MIL/uL (ref 3.87–5.11)
RDW: 14.1 % (ref 11.5–15.5)
WBC: 16.8 10*3/uL — ABNORMAL HIGH (ref 4.0–10.5)
nRBC: 0.1 % (ref 0.0–0.2)

## 2019-03-11 LAB — TYPE AND SCREEN
ABO/RH(D): A POS
Antibody Screen: NEGATIVE

## 2019-03-11 LAB — SARS CORONAVIRUS 2 BY RT PCR (HOSPITAL ORDER, PERFORMED IN ~~LOC~~ HOSPITAL LAB): SARS Coronavirus 2: NEGATIVE

## 2019-03-11 SURGERY — Surgical Case
Anesthesia: Regional

## 2019-03-11 MED ORDER — LABETALOL HCL 200 MG PO TABS
600.0000 mg | ORAL_TABLET | Freq: Three times a day (TID) | ORAL | Status: DC
  Administered 2019-03-11 – 2019-03-13 (×8): 600 mg via ORAL
  Filled 2019-03-11 (×8): qty 3

## 2019-03-11 MED ORDER — MIDAZOLAM HCL 2 MG/2ML IJ SOLN
INTRAMUSCULAR | Status: AC
  Filled 2019-03-11: qty 2

## 2019-03-11 MED ORDER — OXYTOCIN 40 UNITS IN NORMAL SALINE INFUSION - SIMPLE MED
INTRAVENOUS | Status: AC
  Filled 2019-03-11: qty 1000

## 2019-03-11 MED ORDER — MAGNESIUM SULFATE 40 G IN LACTATED RINGERS - SIMPLE
INTRAVENOUS | Status: AC
  Administered 2019-03-11: 4 g via INTRAVENOUS
  Filled 2019-03-11: qty 500

## 2019-03-11 MED ORDER — HYDROMORPHONE HCL 1 MG/ML IJ SOLN
0.2500 mg | INTRAMUSCULAR | Status: DC | PRN
  Administered 2019-03-11: 0.5 mg via INTRAVENOUS

## 2019-03-11 MED ORDER — PRENATAL MULTIVITAMIN CH: 1.0000 | ORAL_TABLET | Freq: Every day | ORAL | Status: DC

## 2019-03-11 MED ORDER — OXYCODONE-ACETAMINOPHEN 5-325 MG PO TABS: 1.0000 | ORAL_TABLET | ORAL | Status: DC | PRN

## 2019-03-11 MED ORDER — KETOROLAC TROMETHAMINE 30 MG/ML IJ SOLN: 30.0000 mg | Freq: Four times a day (QID) | INTRAMUSCULAR | Status: AC | PRN

## 2019-03-11 MED ORDER — ACETAMINOPHEN 10 MG/ML IV SOLN
INTRAVENOUS | Status: AC
  Filled 2019-03-11: qty 100

## 2019-03-11 MED ORDER — NALOXONE HCL 4 MG/10ML IJ SOLN
1.0000 ug/kg/h | INTRAVENOUS | Status: DC | PRN
  Filled 2019-03-11: qty 5

## 2019-03-11 MED ORDER — ONDANSETRON HCL 4 MG/2ML IJ SOLN
4.0000 mg | Freq: Four times a day (QID) | INTRAMUSCULAR | Status: DC | PRN
  Administered 2019-03-11: 4 mg via INTRAVENOUS
  Filled 2019-03-11: qty 2

## 2019-03-11 MED ORDER — HYDROMORPHONE HCL 1 MG/ML IJ SOLN
INTRAMUSCULAR | Status: AC
  Filled 2019-03-11: qty 1

## 2019-03-11 MED ORDER — SODIUM CHLORIDE 0.9 % IV SOLN
INTRAVENOUS | Status: DC | PRN
  Administered 2019-03-11: 40 [IU] via INTRAVENOUS

## 2019-03-11 MED ORDER — SCOPOLAMINE 1 MG/3DAYS TD PT72: 1.0000 | MEDICATED_PATCH | Freq: Once | TRANSDERMAL | Status: DC

## 2019-03-11 MED ORDER — MAGNESIUM SULFATE 40 G IN LACTATED RINGERS - SIMPLE
2.0000 g/h | INTRAVENOUS | Status: DC
  Administered 2019-03-12: 2 g/h via INTRAVENOUS
  Filled 2019-03-11: qty 500

## 2019-03-11 MED ORDER — MORPHINE SULFATE (PF) 0.5 MG/ML IJ SOLN
INTRAMUSCULAR | Status: AC
  Filled 2019-03-11: qty 10

## 2019-03-11 MED ORDER — OXYTOCIN 40 UNITS IN NORMAL SALINE INFUSION - SIMPLE MED: 2.5000 [IU]/h | INTRAVENOUS | Status: DC

## 2019-03-11 MED ORDER — NALBUPHINE HCL 10 MG/ML IJ SOLN
5.0000 mg | Freq: Once | INTRAMUSCULAR | Status: DC | PRN
  Filled 2019-03-11: qty 0.5

## 2019-03-11 MED ORDER — SOD CITRATE-CITRIC ACID 500-334 MG/5ML PO SOLN
30.0000 mL | ORAL | Status: DC | PRN
  Administered 2019-03-11: 30 mL via ORAL
  Filled 2019-03-11: qty 30

## 2019-03-11 MED ORDER — SODIUM CHLORIDE 0.9 % IR SOLN
Status: DC | PRN
  Administered 2019-03-11: 1

## 2019-03-11 MED ORDER — BUPIVACAINE IN DEXTROSE 0.75-8.25 % IT SOLN
INTRATHECAL | Status: DC | PRN
  Administered 2019-03-11: 1.6 mL via INTRATHECAL

## 2019-03-11 MED ORDER — ACETAMINOPHEN 500 MG PO TABS
1000.0000 mg | ORAL_TABLET | Freq: Four times a day (QID) | ORAL | Status: AC
  Administered 2019-03-11 – 2019-03-12 (×3): 1000 mg via ORAL
  Filled 2019-03-11 (×3): qty 2

## 2019-03-11 MED ORDER — PHENYLEPHRINE HCL-NACL 20-0.9 MG/250ML-% IV SOLN
INTRAVENOUS | Status: AC
  Filled 2019-03-11: qty 250

## 2019-03-11 MED ORDER — LIDOCAINE HCL (PF) 1 % IJ SOLN: 30.0000 mL | INTRAMUSCULAR | Status: DC | PRN

## 2019-03-11 MED ORDER — DIPHENHYDRAMINE HCL 50 MG/ML IJ SOLN: 12.5000 mg | INTRAMUSCULAR | Status: DC | PRN

## 2019-03-11 MED ORDER — PENICILLIN G 3 MILLION UNITS IVPB - SIMPLE MED: 3.0000 10*6.[IU] | INTRAVENOUS | Status: DC

## 2019-03-11 MED ORDER — OXYCODONE-ACETAMINOPHEN 5-325 MG PO TABS: 2.0000 | ORAL_TABLET | ORAL | Status: DC | PRN

## 2019-03-11 MED ORDER — PROPOFOL 10 MG/ML IV BOLUS
INTRAVENOUS | Status: AC
  Filled 2019-03-11: qty 40

## 2019-03-11 MED ORDER — KETOROLAC TROMETHAMINE 30 MG/ML IJ SOLN
30.0000 mg | Freq: Four times a day (QID) | INTRAMUSCULAR | Status: AC | PRN
  Administered 2019-03-11: 30 mg via INTRAVENOUS

## 2019-03-11 MED ORDER — FENTANYL CITRATE (PF) 100 MCG/2ML IJ SOLN: 50.0000 ug | INTRAMUSCULAR | Status: DC | PRN

## 2019-03-11 MED ORDER — OXYCODONE HCL 5 MG PO TABS
5.0000 mg | ORAL_TABLET | ORAL | Status: DC | PRN
  Administered 2019-03-11: 5 mg via ORAL
  Filled 2019-03-11: qty 1

## 2019-03-11 MED ORDER — OXYTOCIN 40 UNITS IN NORMAL SALINE INFUSION - SIMPLE MED
1.0000 m[IU]/min | INTRAVENOUS | Status: DC
  Administered 2019-03-11: 10:00:00 2 m[IU]/min via INTRAVENOUS

## 2019-03-11 MED ORDER — LIDOCAINE-EPINEPHRINE (PF) 2 %-1:200000 IJ SOLN
INTRAMUSCULAR | Status: AC
  Filled 2019-03-11: qty 10

## 2019-03-11 MED ORDER — HYDRALAZINE HCL 20 MG/ML IJ SOLN
20.0000 mg | INTRAMUSCULAR | Status: AC
  Administered 2019-03-11: 20 mg via INTRAVENOUS

## 2019-03-11 MED ORDER — NALOXONE HCL 0.4 MG/ML IJ SOLN: 0.4000 mg | INTRAMUSCULAR | Status: DC | PRN

## 2019-03-11 MED ORDER — SODIUM CHLORIDE 0.9% FLUSH: 3.0000 mL | INTRAVENOUS | Status: DC | PRN

## 2019-03-11 MED ORDER — CALCIUM CARBONATE ANTACID 500 MG PO CHEW
2.0000 | CHEWABLE_TABLET | ORAL | Status: DC | PRN
  Administered 2019-03-11: 02:00:00 400 mg via ORAL
  Filled 2019-03-11: qty 2

## 2019-03-11 MED ORDER — FENTANYL CITRATE (PF) 100 MCG/2ML IJ SOLN
INTRAMUSCULAR | Status: AC
  Filled 2019-03-11: qty 2

## 2019-03-11 MED ORDER — ONDANSETRON HCL 4 MG/2ML IJ SOLN
INTRAMUSCULAR | Status: DC | PRN
  Administered 2019-03-11: 4 mg via INTRAVENOUS

## 2019-03-11 MED ORDER — PROPOFOL 10 MG/ML IV BOLUS
INTRAVENOUS | Status: DC | PRN
  Administered 2019-03-11: 200 mg via INTRAVENOUS

## 2019-03-11 MED ORDER — LACTATED RINGERS IV SOLN
INTRAVENOUS | Status: DC
  Administered 2019-03-11: 22:00:00 25 mL/h via INTRAVENOUS
  Administered 2019-03-11: 09:00:00 via INTRAVENOUS

## 2019-03-11 MED ORDER — MAGNESIUM SULFATE BOLUS VIA INFUSION
4.0000 g | Freq: Once | INTRAVENOUS | Status: AC
  Administered 2019-03-11: 07:00:00 4 g via INTRAVENOUS
  Filled 2019-03-11: qty 500

## 2019-03-11 MED ORDER — MORPHINE SULFATE (PF) 0.5 MG/ML IJ SOLN
INTRAMUSCULAR | Status: DC | PRN
  Administered 2019-03-11: .15 mg via EPIDURAL

## 2019-03-11 MED ORDER — NALBUPHINE HCL 10 MG/ML IJ SOLN
5.0000 mg | INTRAMUSCULAR | Status: DC | PRN
  Filled 2019-03-11: qty 0.5

## 2019-03-11 MED ORDER — HYDROMORPHONE HCL 1 MG/ML IJ SOLN
INTRAMUSCULAR | Status: AC
  Filled 2019-03-11: qty 0.5

## 2019-03-11 MED ORDER — SUCCINYLCHOLINE CHLORIDE 20 MG/ML IJ SOLN
INTRAMUSCULAR | Status: DC | PRN
  Administered 2019-03-11: 180 mg via INTRAVENOUS

## 2019-03-11 MED ORDER — DOCUSATE SODIUM 100 MG PO CAPS: 100.0000 mg | ORAL_CAPSULE | Freq: Every day | ORAL | Status: DC

## 2019-03-11 MED ORDER — LABETALOL HCL 100 MG PO TABS
300.0000 mg | ORAL_TABLET | Freq: Once | ORAL | Status: AC
  Administered 2019-03-11: 300 mg via ORAL
  Filled 2019-03-11: qty 3

## 2019-03-11 MED ORDER — DEXTROSE 5 % IV SOLN
INTRAVENOUS | Status: DC | PRN
  Administered 2019-03-11: 3 g via INTRAVENOUS

## 2019-03-11 MED ORDER — SUCCINYLCHOLINE CHLORIDE 200 MG/10ML IV SOSY
PREFILLED_SYRINGE | INTRAVENOUS | Status: AC
  Filled 2019-03-11: qty 10

## 2019-03-11 MED ORDER — SODIUM CHLORIDE 0.9 % IV SOLN: 5.0000 10*6.[IU] | Freq: Once | INTRAVENOUS | Status: DC

## 2019-03-11 MED ORDER — SODIUM CHLORIDE 0.9 % IV SOLN
INTRAVENOUS | Status: DC | PRN
  Administered 2019-03-11: 12:00:00 60 ug/min via INTRAVENOUS

## 2019-03-11 MED ORDER — ONDANSETRON HCL 4 MG/2ML IJ SOLN
INTRAMUSCULAR | Status: AC
  Filled 2019-03-11: qty 2

## 2019-03-11 MED ORDER — SODIUM CHLORIDE 0.9 % IV SOLN
INTRAVENOUS | Status: DC | PRN
  Administered 2019-03-11: 12:00:00 via INTRAVENOUS

## 2019-03-11 MED ORDER — WITCH HAZEL-GLYCERIN EX PADS: 1.0000 "application " | MEDICATED_PAD | CUTANEOUS | Status: DC | PRN

## 2019-03-11 MED ORDER — HYDROMORPHONE HCL 1 MG/ML IJ SOLN
INTRAMUSCULAR | Status: DC | PRN
  Administered 2019-03-11: 1 mg via INTRAVENOUS

## 2019-03-11 MED ORDER — KETOROLAC TROMETHAMINE 30 MG/ML IJ SOLN: 30.0000 mg | Freq: Once | INTRAMUSCULAR | Status: DC | PRN

## 2019-03-11 MED ORDER — LACTATED RINGERS IV SOLN: INTRAVENOUS | Status: DC

## 2019-03-11 MED ORDER — LACTATED RINGERS IV SOLN: 500.0000 mL | INTRAVENOUS | Status: DC | PRN

## 2019-03-11 MED ORDER — ACETAMINOPHEN 325 MG PO TABS
650.0000 mg | ORAL_TABLET | ORAL | Status: DC | PRN
  Administered 2019-03-11: 650 mg via ORAL
  Filled 2019-03-11: qty 2

## 2019-03-11 MED ORDER — ACETAMINOPHEN 10 MG/ML IV SOLN
1000.0000 mg | Freq: Once | INTRAVENOUS | Status: DC | PRN
  Administered 2019-03-11: 1000 mg via INTRAVENOUS

## 2019-03-11 MED ORDER — KETOROLAC TROMETHAMINE 30 MG/ML IJ SOLN
INTRAMUSCULAR | Status: AC
  Filled 2019-03-11: qty 1

## 2019-03-11 MED ORDER — DIBUCAINE (PERIANAL) 1 % EX OINT: 1.0000 "application " | TOPICAL_OINTMENT | CUTANEOUS | Status: DC | PRN

## 2019-03-11 MED ORDER — OXYTOCIN BOLUS FROM INFUSION: 500.0000 mL | Freq: Once | INTRAVENOUS | Status: DC

## 2019-03-11 MED ORDER — FENTANYL CITRATE (PF) 100 MCG/2ML IJ SOLN
INTRAMUSCULAR | Status: DC | PRN
  Administered 2019-03-11: 35 ug via INTRAVENOUS
  Administered 2019-03-11: 50 ug via INTRAVENOUS
  Administered 2019-03-11: 15 ug via INTRAVENOUS

## 2019-03-11 MED ORDER — TERBUTALINE SULFATE 1 MG/ML IJ SOLN: 0.2500 mg | Freq: Once | INTRAMUSCULAR | Status: DC | PRN

## 2019-03-11 MED ORDER — DIPHENHYDRAMINE HCL 25 MG PO CAPS: 25.0000 mg | ORAL_CAPSULE | ORAL | Status: DC | PRN

## 2019-03-11 MED ORDER — SODIUM CHLORIDE 0.9 % IV SOLN
8.0000 mg | Freq: Three times a day (TID) | INTRAVENOUS | Status: DC | PRN
  Filled 2019-03-11: qty 4

## 2019-03-11 MED ORDER — DEXTROSE 5 % IV SOLN
INTRAVENOUS | Status: AC
  Filled 2019-03-11: qty 3000

## 2019-03-11 MED ORDER — NIFEDIPINE ER OSMOTIC RELEASE 30 MG PO TB24
30.0000 mg | ORAL_TABLET | Freq: Once | ORAL | Status: AC
  Administered 2019-03-11: 30 mg via ORAL
  Filled 2019-03-11: qty 1

## 2019-03-11 MED ORDER — ONDANSETRON HCL 4 MG/2ML IJ SOLN: 4.0000 mg | Freq: Three times a day (TID) | INTRAMUSCULAR | Status: DC | PRN

## 2019-03-11 MED ORDER — LIDOCAINE-EPINEPHRINE 2 %-1:50000 IJ SOLN
INTRAMUSCULAR | Status: DC | PRN
  Administered 2019-03-11: 10 mL

## 2019-03-11 SURGICAL SUPPLY — 40 items
BENZOIN TINCTURE PRP APPL 2/3 (GAUZE/BANDAGES/DRESSINGS) ×3 IMPLANT
CHLORAPREP W/TINT 26ML (MISCELLANEOUS) ×3 IMPLANT
CLAMP CORD UMBIL (MISCELLANEOUS) IMPLANT
CLOSURE STERI STRIP 1/2 X4 (GAUZE/BANDAGES/DRESSINGS) ×2 IMPLANT
CLOTH BEACON ORANGE TIMEOUT ST (SAFETY) ×3 IMPLANT
DRSG OPSITE POSTOP 4X10 (GAUZE/BANDAGES/DRESSINGS) ×3 IMPLANT
ELECT REM PT RETURN 9FT ADLT (ELECTROSURGICAL) ×3
ELECTRODE REM PT RTRN 9FT ADLT (ELECTROSURGICAL) ×1 IMPLANT
EXTRACTOR VACUUM BELL STYLE (SUCTIONS) IMPLANT
GAUZE SPONGE 4X4 12PLY STRL LF (GAUZE/BANDAGES/DRESSINGS) ×4 IMPLANT
GLOVE BIO SURGEON STRL SZ7 (GLOVE) ×3 IMPLANT
GLOVE BIOGEL PI IND STRL 7.0 (GLOVE) ×1 IMPLANT
GLOVE BIOGEL PI INDICATOR 7.0 (GLOVE) ×2
GOWN STRL REUS W/TWL LRG LVL3 (GOWN DISPOSABLE) ×6 IMPLANT
HOVERMATT SINGLE USE (MISCELLANEOUS) ×2 IMPLANT
KIT ABG SYR 3ML LUER SLIP (SYRINGE) ×2 IMPLANT
NDL HYPO 25X5/8 SAFETYGLIDE (NEEDLE) IMPLANT
NEEDLE HYPO 22GX1.5 SAFETY (NEEDLE) ×2 IMPLANT
NEEDLE HYPO 25X5/8 SAFETYGLIDE (NEEDLE) ×3 IMPLANT
NS IRRIG 1000ML POUR BTL (IV SOLUTION) ×3 IMPLANT
PACK C SECTION WH (CUSTOM PROCEDURE TRAY) ×3 IMPLANT
PAD ABD 7.5X8 STRL (GAUZE/BANDAGES/DRESSINGS) ×2 IMPLANT
PAD OB MATERNITY 4.3X12.25 (PERSONAL CARE ITEMS) ×3 IMPLANT
PENCIL SMOKE EVAC W/HOLSTER (ELECTROSURGICAL) ×3 IMPLANT
RETRACTOR WND ALEXIS 25 LRG (MISCELLANEOUS) IMPLANT
RTRCTR C-SECT PINK 25CM LRG (MISCELLANEOUS) ×3 IMPLANT
RTRCTR WOUND ALEXIS 25CM LRG (MISCELLANEOUS) ×3
STRIP CLOSURE SKIN 1/2X4 (GAUZE/BANDAGES/DRESSINGS) ×2 IMPLANT
SUT MNCRL 0 VIOLET CTX 36 (SUTURE) ×2 IMPLANT
SUT MONOCRYL 0 CTX 36 (SUTURE) ×4
SUT PLAIN 2 0 XLH (SUTURE) IMPLANT
SUT VIC AB 0 CT1 27 (SUTURE) ×4
SUT VIC AB 0 CT1 27XBRD ANBCTR (SUTURE) ×2 IMPLANT
SUT VIC AB 2-0 CT1 27 (SUTURE) ×2
SUT VIC AB 2-0 CT1 TAPERPNT 27 (SUTURE) ×1 IMPLANT
SUT VIC AB 4-0 KS 27 (SUTURE) ×3 IMPLANT
SYR 10ML LL (SYRINGE) ×2 IMPLANT
TOWEL OR 17X24 6PK STRL BLUE (TOWEL DISPOSABLE) ×3 IMPLANT
TRAY FOLEY W/BAG SLVR 14FR LF (SET/KITS/TRAYS/PACK) ×3 IMPLANT
WATER STERILE IRR 1000ML POUR (IV SOLUTION) ×3 IMPLANT

## 2019-03-11 NOTE — Anesthesia Procedure Notes (Signed)
Procedure Name: Intubation Date/Time: 03/11/2019 12:06 PM Performed by: Lyda Jester, CRNA Pre-anesthesia Checklist: Patient identified, Patient being monitored, Timeout performed, Emergency Drugs available and Suction available Patient Re-evaluated:Patient Re-evaluated prior to induction Oxygen Delivery Method: Circle System Utilized Preoxygenation: Pre-oxygenation with 100% oxygen Induction Type: IV induction Ventilation: Mask ventilation without difficulty Laryngoscope Size: 3 and Glidescope Grade View: Grade II Tube type: Oral Tube size: 7.0 mm Number of attempts: 1 Airway Equipment and Method: stylet Placement Confirmation: ETT inserted through vocal cords under direct vision,  positive ETCO2 and breath sounds checked- equal and bilateral Secured at: 22 cm Tube secured with: Tape Dental Injury: Teeth and Oropharynx as per pre-operative assessment

## 2019-03-11 NOTE — Progress Notes (Signed)
TC from RN at 0630 with report that patient c/o persistent headache all night, now not resolved by tylenol.  In addition, BP now severe at 161/85.  Repeat was 190/114.  I ordered initiation of the hydralazine protocol, 4gm magnesium sulfate bolus, and presented immediately to see and evaluate the patient.   Upon my arrival, she had an episode of nausea and vomiting, new onset.  She reports mild epigastric pain.  She has a dull, throbbing headache that has not resolved.   She received 10mg  IV hydralazine with minimal improvement in BP.    Vitals:   03/11/19 0628 03/11/19 0643 03/11/19 0655 03/11/19 0700  BP: (!) 161/85 (!) 190/114  (!) 174/98  Pulse: 80 92  (!) 131  Resp:   (!) 22   Temp:      TempSrc:      SpO2:    99%  Weight:      Height:       General:  Tired HEENT:  Mild periorbital edema Abdomen:  soft, gravid, no tenderness in upper abdomen Ex:  2+ edema to mid-shin, 2+ DTRs, no clonus  On review of fetal heart rate monitoring, there were periods of moderate variability and minimal variability.  There were multiple episodes of variable decelerations overnight of which I was not notified.  There are no accelerations.    Bedside US:  vertex  A/P:  G2P0 at [redacted]w[redacted]d with superimposed preeclampsia, now with worsening blood pressures.  She is s/p bmz Labs on admission normal, but creatinine has increased from 0.7 to 0.98.  She has new onset periorbital edema.  Her blood pressures are becoming increasing difficult to control, and she now has severe symptoms with persistent headache and nausea and vomiting.  I feel she no longer qualifies for expectant management and that delivery at this time is warranted  I discussed patient with Dr. Philis Pique, oncoming physician.  She agrees with plan for delivery and would like patient to be transferred to L&D for induction of labor.  Plan discussed with patient and partner and she agrees with plan.  --Superimposed preeclampsia, severe by BPs and symptoms.   Continue IV anti-hypertensive protocol.  Continue PO procardia XL and labetalol with goal BP < 160/110. Continue MgSO4.  Will repeat cbc/cmp now given worsening symptoms.  --Prematurity: s/p BMZ.  Will plan NICU consult today --GBS unknown:  PCN --PPx:  SCDs

## 2019-03-11 NOTE — H&P (Addendum)
23 y.o. G2P0010 @ [redacted]w[redacted]d who presents to MAU with chief complaint of headache and severe range blood pressures on her home cuff in the setting of previously diagnosed CHTN with superimposed preeclampsia.  She was admitted last week for the above, received magnesium sulfate, betamethasone and labetalol PO.  The patient elected to discharge herself against medical advice on 03/08/2019.  She was sent home with labetalol 300mg  PO TID.    She reports that her blood pressures at home have been consistently <160/100 until Sunday evening, when they increased to the 180s/100s.  She reports a headache that developed yesterday and did not improve with tylenol.    She received IV labetalol 20, 40 and 80mg  IV in MAU, along with an additional 300mg  PO.  With addition of hydralazine 10mg , her blood pressure decreased to 140s/80s.  She received 1mg  of tylenol and her headache resolved.   Per MAU provider, fetal monitoring at that time was category 1.     Past Medical History:  Diagnosis Date  . Pregnancy induced hypertension     Past Surgical History:  Procedure Laterality Date  . BACK SURGERY      OB History  Gravida Para Term Preterm AB Living  2       1 0  SAB TAB Ectopic Multiple Live Births  1       0    # Outcome Date GA Lbr Len/2nd Weight Sex Delivery Anes PTL Lv  2 Current           1 SAB             Social History   Socioeconomic History  . Marital status: Single    Spouse name: Not on file  . Number of children: Not on file  . Years of education: Not on file  . Highest education level: Not on file  Occupational History  . Not on file  Tobacco Use  . Smoking status: Former Smoker    Types: Cigarettes  . Smokeless tobacco: Never Used  Substance and Sexual Activity  . Alcohol use: No  . Drug use: No  . Sexual activity: Yes  Lifestyle  . Physical activity    Days per week: Not on file    Minutes per session: Not on file  . Stress: Not on file   Sulfa antibiotics    Prenatal  Transfer Tool  Maternal Diabetes: No Genetic Screening: Normal Maternal Ultrasounds/Referrals: Normal Fetal Ultrasounds or other Referrals:  None Maternal Substance Abuse:  No Significant Maternal Medications:  Meds include: Other:  Prozac, labetalol Significant Maternal Lab Results: None  ABO, Rh: --/--/A POS (10/04 2255) Antibody: NEG (10/04 2255) Rubella:  Imm RPR:   NR HBsAg:   Neg HIV:   NR GBS:   too early    Vitals:   03/11/19 0643 03/11/19 0655  BP: (!) 190/114   Pulse: 92   Resp:  (!) 22  Temp:    SpO2:       General:  NAD per CNM overnight FHTs:  130s, periods of moderate vs minimal variability.  Occ variable decels that resolved with correction of BP Toco:  quiet   A/P   23 y.o. G2P0010 [redacted]w[redacted]d presents with superimposed preeclampsia and worsening hypertension Superimporsed preeclampsia. Patient was admitted to antepartum after IV anti-hypertensive protocol with moderate range BP.  She was started on procardia XL 30mg  daily.  Close expectant management Prematurity: s/p BMZ series Continuous fetal monitoring SCDs     [place]

## 2019-03-11 NOTE — Op Note (Signed)
03/10/2019 - 03/11/2019  12:50 PM  PATIENT:  Lisa Sandoval  23 y.o. female  PRE-OPERATIVE DIAGNOSIS:  Non-reassuring FHR, Severe PIH  POST-OPERATIVE DIAGNOSIS:  Non-reassuring FHR, Severe PIH  PROCEDURE:  Procedure(s): Low transverse  CESAREAN SECTION (N/A)  SURGEON:  Surgeon(s) and Role:    * Bobbye Charleston, MD - Primary  ANESTHESIA:   general after spinal did not work  EBL: expected, total pending  LOCAL MEDICATIONS USED:  LIDOCAINE with epi into skin incision  SPECIMEN:  Source of Specimen:  placenta  DISPOSITION OF SPECIMEN:  PATHOLOGY  COUNTS:  YES  TOURNIQUET:  * No tourniquets in log *  DICTATION: .Note written in EPIC  PLAN OF CARE: Admit to inpatient   PATIENT DISPOSITION:  PACU - hemodynamically stable.   Delay start of Pharmacological VTE agent (>24hrs) due to surgical blood loss or risk of bleeding: not applicable  Complications: Spinal was not providing complete pain relief.  Pt felt pain after initial check with allises and after initial skin incision.  Pt then underwent general to complete c/s. Medications:  Ancef, Pitocin, lidocaine with epi Findings:  Baby female, Apgars still pending but Neo stated were about 4,7, weight P.   Normal tubes, ovaries and uterus seen.  Cord was stripped but then cord clamped and cut and baby handed to Neo right away without waiting for a minute Technique:  The patient was prepped and draped in usual sterile fashion after spinal was in.  A foley catheter was used to drain the bladder.  A pfannanstiel incision was made with the scalpel and carried down to the fascia with the bovie cautery.After adequate general anesthesia was achieved, (see above), the fascia was incised in the midline with the scalpel and carried in a transverse curvilinear manner bilaterally.  The fascia was reflected superiorly and inferiorly off the rectus muscles and the muscles split in the midline.  A bowel free portion of the peritoneum was entered  bluntly and then extended in a superior and inferior manner with good visualization of the bowel and bladder.  The Alexis instrument was then placed and the vesico-uterine fascia tented up and incised in a transverse curvilinear manner.  The lower uterine segment wa developed enough that a 2 cm transverse incision was made in the upper portion of the lower uterine segment until the amnion was exposed.   The incision was extended transversely in a blunt manner.  Clear fluid was noted and the baby delivered in the vertex presentation without complication.  The baby was bulb suctioned and the cord was clamped and cut aftet stripping blood from cord into baby.  The baby was then handed to awaiting Neonatology.  The placenta was then delivered manually and the uterus cleared of all debris.  The uterine incision was then closed with a running lock stitch of 0 monocryl.  An imbricating layer of 0 monocryl was closed as well. Excellent hemostasis of the uterine incision was achieved and the abdomen was cleared with irrigation.  The peritoneum was closed with a running stitch of 2-0 vicryl.  This incorporated the rectus muscles as a separate layer.  The fascia was then closed with a running stitch of 0 vicryl.  The subcutaneous layer was closed with interrupted  stitches of 2-0 plain gut.  The skin was closed with 4-0 vicryl on a Keith needle and steri-strips.  The patient tolerated the procedure well and was returned to the recovery room in stable condition.  All counts were correct times three.  Daria [place]

## 2019-03-11 NOTE — Transfer of Care (Signed)
Immediate Anesthesia Transfer of Care Note  Patient: Lisa Sandoval  Procedure(s) Performed: CESAREAN SECTION (N/A )  Patient Location: PACU  Anesthesia Type:General  Level of Consciousness: awake, alert , oriented and sedated  Airway & Oxygen Therapy: Patient Spontanous Breathing and Patient connected to face mask oxygen  Post-op Assessment: Report given to RN and Post -op Vital signs reviewed and stable  Post vital signs: Reviewed and stable  Last Vitals:  Vitals Value Taken Time  BP 130/84 03/11/19 1305  Temp    Pulse 104 03/11/19 1311  Resp 25 03/11/19 1311  SpO2 94 % 03/11/19 1311  Vitals shown include unvalidated device data.  Last Pain:  Vitals:   03/11/19 0830  TempSrc:   PainSc: 8          Complications: No apparent anesthesia complications

## 2019-03-11 NOTE — Progress Notes (Signed)
Cord PH 7.18.

## 2019-03-11 NOTE — Consult Note (Signed)
Neonatology Note:   Attendance at C-section:    I was asked by Dr. Philis Pique to attend this primary C/S at 30 1/7 weeks for severe PIH. The mother is a G2P0010, GBS unk with good prenatal care complicated by [facility] with superimposed pre-eclampsia. ROM 0h 61m prior to delivery, fluid clear. Infant not vigorous nor with good spontaneous cry and tone. Cord clamped and infant immediately brought to warmer (with warming mattress) where drying and stimulating was conducted.  CPAP initiated.  Poor respiratory effort and drive; infant cyanotic.  HR declining <100bpm. PPV begun with need for increased peep.  Infant with good response in effort with improved HR and O2 saturations.  Once >100, PPV stopped and cpap continued.  Fio2 gradually weaned from peak of 90% to 40%.  Father present in ISR during resuscitation and updated.   Baby remained stable on current support thus top closed and baby transported to NICU.    Ap 4/8. Father accompanied [place] to NICU bedside.  No issues during transport.    Monia Sabal Katherina Mires, MD

## 2019-03-11 NOTE — Brief Op Note (Signed)
03/10/2019 - 03/11/2019  12:50 PM  PATIENT:  Lisa Sandoval  23 y.o. female  PRE-OPERATIVE DIAGNOSIS:  Non-reassuring FHR, Severe PIH  POST-OPERATIVE DIAGNOSIS:  Non-reassuring FHR, Severe PIH  PROCEDURE:  Procedure(s): CESAREAN SECTION (N/A)  SURGEON:  Surgeon(s) and Role:    * Bobbye Charleston, MD - Primary  ANESTHESIA:   general after spinal did not work  EBL: expected, total pending  LOCAL MEDICATIONS USED:  LIDOCAINE with epi into skin incision  SPECIMEN:  Source of Specimen:  placenta  DISPOSITION OF SPECIMEN:  PATHOLOGY  COUNTS:  YES  TOURNIQUET:  * No tourniquets in log *  DICTATION: .Note written in EPIC  PLAN OF CARE: Admit to inpatient   PATIENT DISPOSITION:  PACU - hemodynamically stable.   Delay start of Pharmacological VTE agent (>24hrs) due to surgical blood loss or risk of bleeding: not applicable

## 2019-03-11 NOTE — Progress Notes (Signed)
Called office to request prenatal records.

## 2019-03-11 NOTE — Progress Notes (Signed)
Pt comfortable and did get a small nap; just woke up and does not have a HA right at this moment.  Vitals:   03/11/19 0900 03/11/19 0923 03/11/19 1004 03/11/19 1042  BP: (!) 160/95 (!) 143/94 138/84 (!) 141/78  Pulse: 93 (!) 104 100 90  Resp: 16 16 16 16   Temp:      TempSrc:      SpO2:      Weight:      Height:        FHTs 120s with minimal variability and moderate to severe variables.  Variability has been minimal for a while and previously pt was having late decels with each contraction.   Toco q 10 SVE was closed.  A/P 23 y.o. G2P0010 [redacted]w[redacted]d with severe preeclampsia with now nonreassuring FHTs very remote from vaginal delivery.  FHTs have not gotten better with control of BPs but now are worse.  For urgent C/S- all risks and benefits d/w pt.    Lisa Sandoval

## 2019-03-11 NOTE — Anesthesia Procedure Notes (Addendum)
Spinal  Patient location during procedure: OR Start time: 03/11/2019 11:27 AM End time: 03/11/2019 11:37 AM Staffing Anesthesiologist: Freddrick March, MD Performed: anesthesiologist  Preanesthetic Checklist Completed: patient identified, surgical consent, pre-op evaluation, timeout performed, IV checked, risks and benefits discussed and monitors and equipment checked Spinal Block Patient position: sitting Prep: site prepped and draped and DuraPrep Patient monitoring: cardiac monitor, continuous pulse ox and blood pressure Approach: midline Location: L3-4 Injection technique: single-shot Needle Needle type: Pencan and Tuohy  Needle gauge: 24 G Needle length: 9 cm Assessment Sensory level: T6 Events: Spinal placement with Tuohy. CSF visualized with Tuohy needle.  Touhy withdrawn. Spinal needle inserted through Touhy with easy aspiration of CSF and injection of local anesthetic.

## 2019-03-12 LAB — CBC
HCT: 33.1 % — ABNORMAL LOW (ref 36.0–46.0)
Hemoglobin: 11.1 g/dL — ABNORMAL LOW (ref 12.0–15.0)
MCH: 29 pg (ref 26.0–34.0)
MCHC: 33.5 g/dL (ref 30.0–36.0)
MCV: 86.4 fL (ref 80.0–100.0)
Platelets: 186 10*3/uL (ref 150–400)
RBC: 3.83 MIL/uL — ABNORMAL LOW (ref 3.87–5.11)
RDW: 14.4 % (ref 11.5–15.5)
WBC: 15.2 10*3/uL — ABNORMAL HIGH (ref 4.0–10.5)
nRBC: 0 % (ref 0.0–0.2)

## 2019-03-12 LAB — RPR: RPR Ser Ql: NONREACTIVE

## 2019-03-12 MED ORDER — PRENATAL MULTIVITAMIN CH
1.0000 | ORAL_TABLET | Freq: Every day | ORAL | Status: DC
  Administered 2019-03-12 – 2019-03-16 (×4): 1 via ORAL
  Filled 2019-03-12 (×5): qty 1

## 2019-03-12 MED ORDER — OXYCODONE-ACETAMINOPHEN 5-325 MG PO TABS
1.0000 | ORAL_TABLET | ORAL | Status: DC | PRN
  Administered 2019-03-12 – 2019-03-17 (×8): 1 via ORAL
  Filled 2019-03-12 (×8): qty 1

## 2019-03-12 MED ORDER — MAGNESIUM SULFATE 40 GM/1000ML IV SOLN
2.0000 g/h | INTRAVENOUS | Status: AC
  Filled 2019-03-12 (×2): qty 1000

## 2019-03-12 MED ORDER — OXYTOCIN 40 UNITS IN NORMAL SALINE INFUSION - SIMPLE MED: 2.5000 [IU]/h | INTRAVENOUS | Status: AC

## 2019-03-12 MED ORDER — SIMETHICONE 80 MG PO CHEW
80.0000 mg | CHEWABLE_TABLET | ORAL | Status: DC
  Administered 2019-03-12 – 2019-03-13 (×2): 80 mg via ORAL
  Filled 2019-03-12 (×5): qty 1

## 2019-03-12 MED ORDER — SIMETHICONE 80 MG PO CHEW
80.0000 mg | CHEWABLE_TABLET | Freq: Three times a day (TID) | ORAL | Status: DC
  Administered 2019-03-12 – 2019-03-15 (×9): 80 mg via ORAL
  Filled 2019-03-12 (×10): qty 1

## 2019-03-12 MED ORDER — SENNOSIDES-DOCUSATE SODIUM 8.6-50 MG PO TABS
2.0000 | ORAL_TABLET | ORAL | Status: DC
  Administered 2019-03-12 – 2019-03-15 (×4): 2 via ORAL
  Filled 2019-03-12 (×6): qty 2

## 2019-03-12 MED ORDER — SIMETHICONE 80 MG PO CHEW: 80.0000 mg | CHEWABLE_TABLET | ORAL | Status: DC | PRN

## 2019-03-12 MED ORDER — MEASLES, MUMPS & RUBELLA VAC IJ SOLR: 0.5000 mL | Freq: Once | INTRAMUSCULAR | Status: DC

## 2019-03-12 MED ORDER — FERROUS SULFATE 325 (65 FE) MG PO TABS
325.0000 mg | ORAL_TABLET | Freq: Two times a day (BID) | ORAL | Status: DC
  Administered 2019-03-12 – 2019-03-17 (×10): 325 mg via ORAL
  Filled 2019-03-12 (×12): qty 1

## 2019-03-12 MED ORDER — MENTHOL 3 MG MT LOZG: 1.0000 | LOZENGE | OROMUCOSAL | Status: DC | PRN

## 2019-03-12 MED ORDER — NIFEDIPINE ER OSMOTIC RELEASE 30 MG PO TB24
30.0000 mg | ORAL_TABLET | Freq: Once | ORAL | Status: AC
  Administered 2019-03-12: 08:00:00 30 mg via ORAL
  Filled 2019-03-12: qty 1

## 2019-03-12 MED ORDER — LACTATED RINGERS IV SOLN
INTRAVENOUS | Status: DC
  Administered 2019-03-14: 17:00:00 via INTRAVENOUS

## 2019-03-12 MED ORDER — IBUPROFEN 800 MG PO TABS
800.0000 mg | ORAL_TABLET | Freq: Three times a day (TID) | ORAL | Status: DC
  Administered 2019-03-12 – 2019-03-13 (×5): 800 mg via ORAL
  Filled 2019-03-12 (×6): qty 1

## 2019-03-12 MED ORDER — TETANUS-DIPHTH-ACELL PERTUSSIS 5-2.5-18.5 LF-MCG/0.5 IM SUSP: 0.5000 mL | Freq: Once | INTRAMUSCULAR | Status: DC

## 2019-03-12 MED ORDER — BISACODYL 10 MG RE SUPP: 10.0000 mg | Freq: Every day | RECTAL | Status: DC | PRN

## 2019-03-12 MED ORDER — ZOLPIDEM TARTRATE 5 MG PO TABS
5.0000 mg | ORAL_TABLET | Freq: Every evening | ORAL | Status: DC | PRN
  Administered 2019-03-13: 23:00:00 5 mg via ORAL
  Filled 2019-03-12: qty 1

## 2019-03-12 MED ORDER — FLEET ENEMA 7-19 GM/118ML RE ENEM: 1.0000 | ENEMA | Freq: Every day | RECTAL | Status: DC | PRN

## 2019-03-12 MED ORDER — METHYLERGONOVINE MALEATE 0.2 MG/ML IJ SOLN: 0.2000 mg | INTRAMUSCULAR | Status: DC | PRN

## 2019-03-12 MED ORDER — COCONUT OIL OIL: 1.0000 "application " | TOPICAL_OIL | Status: DC | PRN

## 2019-03-12 MED ORDER — METHYLERGONOVINE MALEATE 0.2 MG PO TABS: 0.2000 mg | ORAL_TABLET | ORAL | Status: DC | PRN

## 2019-03-12 NOTE — Progress Notes (Signed)
Patient is eating, ambulating, voiding.  Pain control is good.  Appropriate lochia, no complaints.  Denies HA/vision change/RUQ pain.  Vitals:   03/12/19 0629 03/12/19 0747 03/12/19 0810 03/12/19 0900  BP:  (!) 159/98    Pulse:  99    Resp: 19 18 18 18   Temp:      TempSrc:      SpO2: 96% 99%    Weight:      Height:        Fundus firm Abd: soft, nontender Ext: no calf tenderness  Lab Results  Component Value Date   WBC 15.2 (H) 03/12/2019   HGB 11.1 (L) 03/12/2019   HCT 33.1 (L) 03/12/2019   MCV 86.4 03/12/2019   PLT 186 03/12/2019    --/--/A POS (10/04 2255)  A/P Post op day #1 s/p c/s 30 weeks for CHTN and superimposed PEC with severe range BPs and non-reassuring fetal status. MgSO4 will be discontinued at 24 hrs Continue labetalol 600mg  tid and Procardia 30 mg qd CBC/CMP repeat tomorrow  Routine care.    Allyn Kenner

## 2019-03-12 NOTE — Lactation Note (Signed)
This note was copied from a baby's chart. Lactation Consultation Note  Patient Name: Lisa Sandoval [id number] Date: 03/12/2019   Mom was provided NICU booklet; I pointed out to her pp 4-5 (milk storage & pumping frequency charts). I also provided Mom with numbered colostrum stickers & colostrum containers. Mom says she knows how to do hand expression b/c she & the FOB were doing it at home. Mom reports + breast changes w/pregnancy. Mom says she had nipple rings until recently.   Mom says she has [place] with [place] does not have a pump at home. [facility] referral form faxed. Mom says she knows about washing her pump parts.   Mom reports having pumped q3h overnight.    I asked Mom to tell her RN if there are any increases to her labetalol dosage. Mom said she will & that she thinks she is already on the max dosage. Torie, RN in NICU is aware of my communication with Beryle Lathe, NICU pharmacist.  Matthias Hughs [facility] 03/12/2019, 1:55 PM

## 2019-03-12 NOTE — Lactation Note (Signed)
This note was copied from a baby's chart. Lactation Consultation Note  Patient Name: Girl Keyona Prudhomme [id number] Date: 03/12/2019   Mom has initiated pumping & denies discomfort. Mom has obtained little so far & has not been saving it. I told Mom about putting small amounts in a colostrum container so that the NICU nurse can do oral care with the colostrum. Mom expressed interest in that.   Mom noted to be on labetalol 600 mg tid. Although labetalol is an L2, high doses may correspond with bradycardia in preterm infants (LactMed/Toxnet). I spoke with Beryle Lathe, NICU Pharmacist. She called back after reviewing to say that it was fine to proceed, as infant is being monitored. Pharmacist requested that RN be made aware so RN can observe if there are any changes in vital signs.    Matthias Hughs [facility] 03/12/2019, 1:10 PM

## 2019-03-13 ENCOUNTER — Encounter (HOSPITAL_COMMUNITY): Payer: Self-pay | Admitting: Obstetrics and Gynecology

## 2019-03-13 LAB — COMPREHENSIVE METABOLIC PANEL
ALT: 23 U/L (ref 0–44)
ALT: 23 U/L (ref 0–44)
AST: 20 U/L (ref 15–41)
AST: 19 U/L (ref 15–41)
Albumin: 2.1 g/dL — ABNORMAL LOW (ref 3.5–5.0)
Albumin: 2 g/dL — ABNORMAL LOW (ref 3.5–5.0)
Alkaline Phosphatase: 68 U/L (ref 38–126)
Alkaline Phosphatase: 55 U/L (ref 38–126)
Anion gap: 8 (ref 5–15)
Anion gap: 7 (ref 5–15)
BUN: 17 mg/dL (ref 6–20)
BUN: 15 mg/dL (ref 6–20)
CO2: 20 mmol/L — ABNORMAL LOW (ref 22–32)
CO2: 20 mmol/L — ABNORMAL LOW (ref 22–32)
Calcium: 7.8 mg/dL — ABNORMAL LOW (ref 8.9–10.3)
Calcium: 8.1 mg/dL — ABNORMAL LOW (ref 8.9–10.3)
Chloride: 110 mmol/L (ref 98–111)
Chloride: 111 mmol/L (ref 98–111)
Creatinine, Ser: 1.07 mg/dL — ABNORMAL HIGH (ref 0.44–1.00)
Creatinine, Ser: 0.96 mg/dL (ref 0.44–1.00)
GFR calc Af Amer: >60 mL/min (ref >60)
GFR calc Af Amer: >60 mL/min (ref >60)
GFR calc non Af Amer: >60 mL/min (ref >60)
GFR calc non Af Amer: >60 mL/min (ref >60)
Glucose, Bld: 96 mg/dL (ref 70–99)
Glucose, Bld: 81 mg/dL (ref 70–99)
Potassium: 4.2 mmol/L (ref 3.5–5.1)
Potassium: 4 mmol/L (ref 3.5–5.1)
Sodium: 138 mmol/L (ref 135–145)
Sodium: 138 mmol/L (ref 135–145)
Total Bilirubin: 0.5 mg/dL (ref 0.3–1.2)
Total Bilirubin: 0.6 mg/dL (ref 0.3–1.2)
Total Protein: 4.9 g/dL — ABNORMAL LOW (ref 6.5–8.1)
Total Protein: 4.6 g/dL — ABNORMAL LOW (ref 6.5–8.1)

## 2019-03-13 LAB — CBC
HCT: 33.7 % — ABNORMAL LOW (ref 36.0–46.0)
HCT: 30.1 % — ABNORMAL LOW (ref 36.0–46.0)
Hemoglobin: 10.9 g/dL — ABNORMAL LOW (ref 12.0–15.0)
Hemoglobin: 9.5 g/dL — ABNORMAL LOW (ref 12.0–15.0)
MCH: 28.5 pg (ref 26.0–34.0)
MCH: 28.2 pg (ref 26.0–34.0)
MCHC: 32.3 g/dL (ref 30.0–36.0)
MCHC: 31.6 g/dL (ref 30.0–36.0)
MCV: 88.2 fL (ref 80.0–100.0)
MCV: 89.3 fL (ref 80.0–100.0)
Platelets: 177 10*3/uL (ref 150–400)
Platelets: 148 10*3/uL — ABNORMAL LOW (ref 150–400)
RBC: 3.82 MIL/uL — ABNORMAL LOW (ref 3.87–5.11)
RBC: 3.37 MIL/uL — ABNORMAL LOW (ref 3.87–5.11)
RDW: 14.6 % (ref 11.5–15.5)
RDW: 14.5 % (ref 11.5–15.5)
WBC: 13.4 10*3/uL — ABNORMAL HIGH (ref 4.0–10.5)
WBC: 13.6 10*3/uL — ABNORMAL HIGH (ref 4.0–10.5)
nRBC: 0 % (ref 0.0–0.2)
nRBC: 0 % (ref 0.0–0.2)

## 2019-03-13 LAB — SURGICAL PATHOLOGY

## 2019-03-13 MED ORDER — ACETAMINOPHEN 500 MG PO TABS
1000.0000 mg | ORAL_TABLET | Freq: Once | ORAL | Status: AC
  Administered 2019-03-13: 1000 mg via ORAL
  Filled 2019-03-13: qty 2

## 2019-03-13 MED ORDER — NIFEDIPINE ER OSMOTIC RELEASE 30 MG PO TB24
30.0000 mg | ORAL_TABLET | Freq: Once | ORAL | Status: AC
  Administered 2019-03-14: 30 mg via ORAL
  Filled 2019-03-13: qty 1

## 2019-03-13 MED ORDER — NIFEDIPINE ER OSMOTIC RELEASE 30 MG PO TB24
30.0000 mg | ORAL_TABLET | Freq: Every day | ORAL | Status: DC
  Administered 2019-03-13: 09:00:00 30 mg via ORAL
  Filled 2019-03-13: qty 1

## 2019-03-13 MED ORDER — IBUPROFEN 800 MG PO TABS
400.0000 mg | ORAL_TABLET | Freq: Four times a day (QID) | ORAL | Status: DC | PRN
  Administered 2019-03-16 – 2019-03-17 (×3): 400 mg via ORAL
  Filled 2019-03-13 (×5): qty 1

## 2019-03-13 MED ORDER — LABETALOL HCL 200 MG PO TABS
200.0000 mg | ORAL_TABLET | Freq: Once | ORAL | Status: AC
  Administered 2019-03-13: 18:00:00 200 mg via ORAL
  Filled 2019-03-13: qty 1

## 2019-03-13 MED ORDER — LABETALOL HCL 200 MG PO TABS
800.0000 mg | ORAL_TABLET | Freq: Three times a day (TID) | ORAL | Status: DC
  Administered 2019-03-13 – 2019-03-17 (×11): 800 mg via ORAL
  Filled 2019-03-13 (×12): qty 4

## 2019-03-13 MED ORDER — LACTATED RINGERS IV BOLUS
500.0000 mL | Freq: Once | INTRAVENOUS | Status: AC
  Administered 2019-03-13: 500 mL via INTRAVENOUS

## 2019-03-13 NOTE — Progress Notes (Addendum)
Pt's BP at 11:56am was down to 139/69.  At 13:22 pt still c/o throbbing headache only relieved by hot water in shower.  She points to top of head between crown and forehead and rates the pain 8/10.  No other neuro sx.  Rates incisional pain as minimal.  Scheduled ibuprofen given and instant heat pack for head.  Phoned Dr. Wendi Snipes with above information.  See orders for LR bolus and 1 gram Tylenol.

## 2019-03-13 NOTE — Progress Notes (Signed)
Subjective: Postpartum Day 2: Cesarean Delivery Patient reports overall feeling ok, HA persists, occurred right before delivery/epidural, pressure, worse when standing, no issues laying down, resolves after standing for a bit. Denies VC/SOB/RUQ pain. Pain controlled. Tolerating PO. Voiding spont.  Objective: Vital signs in last 24 hours:  Patient Vitals for the past 24 hrs:  BP Temp Temp src Pulse Resp SpO2  03/13/19 0820 (!) 148/80 98.4 F (36.9 C) Oral 86 17 99 %  03/13/19 0458 (!) 156/90 98.3 F (36.8 C) Oral 90 18 98 %  03/12/19 2335 (!) 154/90 98.2 F (36.8 C) Oral 74 18 99 %  03/12/19 1918 (!) 157/101 98.2 F (36.8 C) Oral 84 16 98 %  03/12/19 1611 (!) 154/93 - - 74 - -  03/12/19 1600 (!) 160/95 98.4 F (36.9 C) Oral 74 18 98 %  03/12/19 1559 - 98.1 F (36.7 C) Oral - 19 -  03/12/19 1200 139/90 97.9 F (36.6 C) Oral 84 18 99 %  03/12/19 1105 - - - - 18 -  03/12/19 1015 - - - - 17 -  03/12/19 0900 - - - - 18 -     Physical Exam:  General: alert and no distress  RRR, CTAB Lochia: appropriate Uterine Fundus: firm Incision: dressing in place, clean DVT Evaluation: No evidence of DVT seen on physical exam. Sensation intact bilaterally, upper extremity strength 5/5  Recent Labs    03/10/19 2255 03/11/19 0957 03/12/19 0730 03/13/19 0550  WBC 16.9* 16.8* 15.2* 13.4*  HGB 11.4* 11.8* 11.1* 10.9*  HCT 33.3* 36.4 33.1* 33.7*  PLT 223 222 186 177  NA 136  --   --  138  K 4.4  --   --  4.2  CL 107  --   --  110  BUN 27*  --   --  17  CREATININE 0.98  --   --  1.07*  AST 23  --   --  20  ALT 28  --   --  23  BILITOT 0.3  --   --  0.5   Assessment/Plan: 1. Status post Cesarean section. Doing well postoperatively.  2. CHTN with superimposed preeclampsia: severe Bps prior to delivery, Cr increased 0.7>0.98, periorbital edema, received mag prior to delivery and now s/p 24h postpartum magnesium. For Bps, she required IV megs prior to delivery, postop she is on labetalol  600mg  TID and procardia 30 xL. Bps elevated yesterday, continue to monitor today Labs this AM significant for a Cr 1.07, UOP has been adequate (100cc/h), continue to monitor and repeat CBC/CMP today 3. HA: related to positional changes, gross neuro exam normal. Cont to monitor Continue current care. Blood type A positive/Rubella immune/Baby in NICU  Jemya Depierro K Taam-Akelman 03/13/2019, 8:43 AM

## 2019-03-13 NOTE — Progress Notes (Signed)
Patient Vitals for the past 24 hrs:  BP Temp Temp src Pulse Resp SpO2  03/13/19 2333 (!) 163/85 - - 89 - -  03/13/19 2315 (!) 164/96 - - 89 - -  03/13/19 2300 (!) 160/95 - - 79 - -  03/13/19 1955 (!) 152/86 - - 85 20 -  03/13/19 1940 (!) 159/108 - - 76 - -  03/13/19 1926 (!) 160/105 98.3 F (36.8 C) Oral 76 18 -  03/13/19 1614 (!) 159/78 - - 81 - -  03/13/19 1156 139/69 98.6 F (37 C) Oral 88 18 98 %  03/13/19 1155 - - - - - 99 %  03/13/19 0820 (!) 148/80 98.4 F (36.9 C) Oral 86 17 99 %  03/13/19 0458 (!) 156/90 98.3 F (36.8 C) Oral 90 18 98 %   Recent Labs    03/12/19 0730 03/13/19 0550 03/13/19 1839  WBC 15.2* 13.4* 13.6*  HGB 11.1* 10.9* 9.5*  HCT 33.1* 33.7* 30.1*  PLT 186 177 148*  NA  --  138 138  K  --  4.2 4.0  CL  --  110 111  BUN  --  17 15  CREATININE  --  1.07* 0.96  AST  --  20 19  ALT  --  23 23  BILITOT  --  0.5 0.6    Now requiring IV meds: labetalol 20, 40, 80 given (2322, 2334, 2354). Will give additional dose of Procardia 30 xL with plan to increase to 60 xL on 10/9AM. Labs remain wnl.  Continue to monitor Antonios Ostrow K Taam-Akelman 03/14/19 12:01 AM

## 2019-03-13 NOTE — Progress Notes (Signed)
OBGYN Note Patient Vitals for the past 24 hrs:  BP Temp Temp src Pulse Resp SpO2  03/13/19 1614 (!) 159/78 - - 81 - -  03/13/19 1156 139/69 98.6 F (37 C) Oral 88 18 98 %  03/13/19 0820 (!) 148/80 98.4 F (36.9 C) Oral 86 17 99 %  03/13/19 0458 (!) 156/90 98.3 F (36.8 C) Oral 90 18 98 %  03/12/19 2335 (!) 154/90 98.2 F (36.8 C) Oral 74 18 99 %  03/12/19 1918 (!) 157/101 98.2 F (36.8 C) Oral 84 16 98 %   Patient with multiple Bps > 150/90, will increase labetalol from 600mg  TID to 800mg  TID, continue procardia 30 xL. Patient's postop pain has been minimal, since ibuprofen may increase BP will change it from scheduled 800mg  to 400mg  prn. HA resolved with 1g tylenol and LR 500cc bolus.  -Overnight, for mild pain give tylenol 650mg  prn, if that does not work then [employer] or Ambulance person. For HA give tylenol, if that does not work, consider benadryl/compazine.   Lisa Sandoval K Taam-Akelman 03/13/19 6:23 PM

## 2019-03-13 NOTE — Clinical Social Work Maternal (Signed)
CLINICAL SOCIAL WORK MATERNAL/CHILD NOTE  Patient Details  Name: Lisa Sandoval MRN: [id number] Date of Birth: 01/22/1996  Date:  04/11/2019  Clinical Social Worker Initiating Note:  Finis Bud Date/Time: Initiated:  03/12/19/1150     Child's Name:  Lisa Sandoval   Biological Parents:  Mother, Father   Need for Interpreter:  None   Reason for Referral:  Behavioral Health Concerns, Parental Support of Premature Babies < 32 weeks/or Critically Ill babies   Address:  [place] [address]    Phone number:  [phone] (home)     Additional phone number: FOB: [phone].  Household Members/Support Persons (HM/SP):   Household Member/Support Person 1   HM/SP Name Relationship DOB or Age  HM/SP -1 Coby Brown FOB 06/02/1997  HM/SP -2        HM/SP -3        HM/SP -4        HM/SP -5        HM/SP -6        HM/SP -7        HM/SP -8          Natural Supports (not living in the home):  Immediate Family, Extended Family, Parent, Friends   Professional Supports: None   Employment: Full-time   Type of Work: Librarian, academic Med. Tech.   Education:  Nurse, adult   Homebound arranged:    Museum/gallery curator Resources:  Medicaid   Other Resources:  Location manager provided MOB with information to apply for [employer].)   Cultural/Religious Considerations Which May Impact Care:  None Reported  Strengths:  Ability to meet basic needs , Psychotropic Medications, Compliance with medical plan , Understanding of illness, Home prepared for child (Pediatrician list was provided to MOB.)   Psychotropic Medications:  Prozac      Pediatrician:       Pediatrician List:   [place]      Pediatrician Fax Number:    Risk Factors/Current Problems:  Mental Health Concerns    Cognitive State:      Mood/Affect:  Bright , Happy , Interested , Comfortable     CSW Assessment: CSW met with MOB in room 102 to complete an assessment for NICU admission and MH hx. When CSW arrived MOB was resting in bed.  However, during the assessment FOB arrived, MOB gave CSW permission to complete the assessment while FOB was present. The couple appeared supportive of one another.  They were easy to engage, forthcoming, and receptive to meeting with CSW.   CSW asked MOB about her thoughts and feelings regarding infant's NICU admission.  MOB stated, "I feel good, my baby is safe and the doctors keep me updated." CSW validated and normalized MOB's thoughts and feelings and shared with MOB other emotions that she may experience during the postpartum period.   CSW asked about MOB's MH hx and MOB openly acknowledged a dx of anxiety during her pregnancy.  MOB shared, "I was very anxious during my pregnancy so my doctor prescribed me Prozac." Per MOB, Prozac assisted with managing MOB's symptoms. MOB present with insight and awareness and did not demonstrate any acute symptoms during the assessment. CSW offered MOB resources for outpatient counseling and MOB declined.   CSW provided education regarding the baby blues period vs. perinatal mood disorders, discussed treatment and gave resources for mental health follow  up if concerns arise.  CSW recommends self-evaluation during the postpartum time period using the New Mom Checklist from Postpartum Progress and encouraged MOB to contact a medical professional if symptoms are noted at any time. CSW reported having a good support team and denied SI, HI, an DV. MOB assured CSW that she feels comfortable seeking help if help is warranted.   CSW provided review of Sudden Infant Death Syndrome (SIDS) precautions. MOB and FOB responded appropriately to CSW's questions and asked appropriate questions.   MOB also reported having all essential items for infant including a new car seat and a crib.  MOB and FOB expressed feelings of excitement  about being new parents and feeling prepared to parent.   CSW Plan/Description:  Psychosocial Support and Ongoing Assessment of Needs, Sudden Infant Death Syndrome (SIDS) Education, Perinatal Mood and Anxiety Disorder (PMADs) Education, Other Patient/Family Education, Other Information/Referral to [employer], MSW, [employer] Clinical Social Work [phone]  Dimple Nanas, LCSW 03/13/2019, 12:00 PM

## 2019-03-13 NOTE — Lactation Note (Signed)
This note was copied from a baby's chart. Lactation Consultation Note  Patient Name: Lisa Sandoval [id number] Date: 03/13/2019   Attempted to follow up with Ms Toni. LC walked into the room and the patient was in the shower. She stated she was fine and she will call us if she has any concerns after her shower. LC will follow up as needed. No concerns or further information from the patient at this time.      Consult Status      Lenore Manner 03/13/2019, 9:23 PM

## 2019-03-14 LAB — COMPREHENSIVE METABOLIC PANEL
ALT: 26 U/L (ref 0–44)
AST: 27 U/L (ref 15–41)
Albumin: 2 g/dL — ABNORMAL LOW (ref 3.5–5.0)
Alkaline Phosphatase: 63 U/L (ref 38–126)
Anion gap: 7 (ref 5–15)
BUN: 8 mg/dL (ref 6–20)
CO2: 20 mmol/L — ABNORMAL LOW (ref 22–32)
Calcium: 8.4 mg/dL — ABNORMAL LOW (ref 8.9–10.3)
Chloride: 110 mmol/L (ref 98–111)
Creatinine, Ser: 0.9 mg/dL (ref 0.44–1.00)
GFR calc Af Amer: >60 mL/min (ref >60)
GFR calc non Af Amer: >60 mL/min (ref >60)
Glucose, Bld: 90 mg/dL (ref 70–99)
Potassium: 3.9 mmol/L (ref 3.5–5.1)
Sodium: 137 mmol/L (ref 135–145)
Total Bilirubin: 0.4 mg/dL (ref 0.3–1.2)
Total Protein: 4.8 g/dL — ABNORMAL LOW (ref 6.5–8.1)

## 2019-03-14 LAB — CBC
HCT: 29.6 % — ABNORMAL LOW (ref 36.0–46.0)
Hemoglobin: 9.8 g/dL — ABNORMAL LOW (ref 12.0–15.0)
MCH: 29 pg (ref 26.0–34.0)
MCHC: 33.1 g/dL (ref 30.0–36.0)
MCV: 87.6 fL (ref 80.0–100.0)
Platelets: 134 10*3/uL — ABNORMAL LOW (ref 150–400)
RBC: 3.38 MIL/uL — ABNORMAL LOW (ref 3.87–5.11)
RDW: 14.6 % (ref 11.5–15.5)
WBC: 13.3 10*3/uL — ABNORMAL HIGH (ref 4.0–10.5)
nRBC: 0 % (ref 0.0–0.2)

## 2019-03-14 MED ORDER — MAGNESIUM SULFATE BOLUS VIA INFUSION
4.0000 g | Freq: Once | INTRAVENOUS | Status: AC
  Administered 2019-03-14: 17:00:00 4 g via INTRAVENOUS
  Filled 2019-03-14: qty 1000

## 2019-03-14 MED ORDER — NIFEDIPINE ER OSMOTIC RELEASE 30 MG PO TB24
60.0000 mg | ORAL_TABLET | Freq: Every day | ORAL | Status: DC
  Administered 2019-03-15 – 2019-03-17 (×3): 60 mg via ORAL
  Filled 2019-03-14 (×3): qty 2

## 2019-03-14 MED ORDER — LABETALOL HCL 5 MG/ML IV SOLN: 40.0000 mg | INTRAVENOUS | Status: DC | PRN

## 2019-03-14 MED ORDER — MAGNESIUM SULFATE 40 GM/1000ML IV SOLN
2.0000 g/h | INTRAVENOUS | Status: AC
  Administered 2019-03-14 – 2019-03-15 (×2): 2 g/h via INTRAVENOUS
  Filled 2019-03-14 (×2): qty 1000

## 2019-03-14 MED ORDER — DEXAMETHASONE SODIUM PHOSPHATE 10 MG/ML IJ SOLN
10.0000 mg | Freq: Once | INTRAMUSCULAR | Status: AC
  Administered 2019-03-14: 18:00:00 10 mg via INTRAVENOUS
  Filled 2019-03-14 (×2): qty 1

## 2019-03-14 MED ORDER — LORATADINE 10 MG PO TABS
10.0000 mg | ORAL_TABLET | Freq: Every day | ORAL | Status: DC
  Administered 2019-03-14 – 2019-03-17 (×4): 10 mg via ORAL
  Filled 2019-03-14 (×4): qty 1

## 2019-03-14 MED ORDER — BUTALBITAL-APAP-CAFFEINE 50-325-40 MG PO TABS
2.0000 | ORAL_TABLET | ORAL | Status: DC | PRN
  Administered 2019-03-14 – 2019-03-17 (×6): 2 via ORAL
  Filled 2019-03-14 (×6): qty 2

## 2019-03-14 MED ORDER — NIFEDIPINE ER OSMOTIC RELEASE 30 MG PO TB24
30.0000 mg | ORAL_TABLET | Freq: Once | ORAL | Status: AC
  Administered 2019-03-14: 30 mg via ORAL
  Filled 2019-03-14: qty 1

## 2019-03-14 MED ORDER — LABETALOL HCL 5 MG/ML IV SOLN: 80.0000 mg | INTRAVENOUS | Status: DC | PRN

## 2019-03-14 MED ORDER — DIPHENHYDRAMINE HCL 50 MG/ML IJ SOLN
25.0000 mg | Freq: Once | INTRAMUSCULAR | Status: DC
  Filled 2019-03-14: qty 1

## 2019-03-14 MED ORDER — PROMETHAZINE HCL 25 MG/ML IJ SOLN
25.0000 mg | Freq: Once | INTRAMUSCULAR | Status: AC
  Administered 2019-03-14: 25 mg via INTRAVENOUS
  Filled 2019-03-14: qty 1

## 2019-03-14 MED ORDER — HYDRALAZINE HCL 20 MG/ML IJ SOLN: 10.0000 mg | INTRAMUSCULAR | Status: DC | PRN

## 2019-03-14 MED ORDER — LACTATED RINGERS IV SOLN: INTRAVENOUS | Status: DC

## 2019-03-14 MED ORDER — LABETALOL HCL 5 MG/ML IV SOLN: 20.0000 mg | INTRAVENOUS | Status: DC | PRN

## 2019-03-14 MED ORDER — ENALAPRIL MALEATE 5 MG PO TABS
5.0000 mg | ORAL_TABLET | Freq: Every day | ORAL | Status: DC
  Administered 2019-03-14 – 2019-03-17 (×4): 5 mg via ORAL
  Filled 2019-03-14 (×4): qty 1

## 2019-03-14 NOTE — Anesthesia Preprocedure Evaluation (Signed)
Anesthesia Evaluation  Patient identified by MRN, date of birth, ID band Patient awake    Reviewed: Allergy & Precautions, NPO status , Patient's Chart, lab work & pertinent test results  Airway Mallampati: III  TM Distance: >3 FB Neck ROM: Full    Dental no notable dental hx. (+) Teeth Intact, Dental Advisory Given   Pulmonary neg pulmonary ROS, former smoker,    Pulmonary exam normal breath sounds clear to auscultation       Cardiovascular hypertension, negative cardio ROS Normal cardiovascular exam Rhythm:Regular Rate:Normal     Neuro/Psych PSYCHIATRIC DISORDERS Anxiety negative neurological ROS     GI/Hepatic negative GI ROS, Neg liver ROS,   Endo/Other  Morbid obesity  Renal/GU negative Renal ROS  negative genitourinary   Musculoskeletal negative musculoskeletal ROS (+)   Abdominal   Peds  Hematology negative hematology ROS (+)   Anesthesia Other Findings C/S for nonreassuring FHR and severe preE  Reproductive/Obstetrics (+) Pregnancy                             Anesthesia Physical Anesthesia Plan  ASA: III  Anesthesia Plan: Spinal   Post-op Pain Management:    Induction:   PONV Risk Score and Plan: Treatment may vary due to age or medical condition  Airway Management Planned: Natural Airway  Additional Equipment:   Intra-op Plan:   Post-operative Plan:   Informed Consent: I have reviewed the patients History and Physical, chart, labs and discussed the procedure including the risks, benefits and alternatives for the proposed anesthesia with the patient or authorized representative who has indicated his/her understanding and acceptance.     Dental advisory given  Plan Discussed with: CRNA  Anesthesia Plan Comments:         Anesthesia Quick Evaluation

## 2019-03-14 NOTE — Lactation Note (Addendum)
This note was copied from a baby's chart. Lactation Consultation Note  Patient Name: Lisa Sandoval Date: 03/14/2019 Reason for consult: Follow-up assessment;Preterm <34wks;Primapara;1st time breastfeeding;NICU baby;Infant < 6lbs  P1 mother whose infant is now 71 hours old.  This is a preterm infant at 30+1 weeks weighing < 6 lbs and in the NICU.  Mother resting when I arrived.  She has a DEBP set up in her room but has not pumped since last night.  Educated and reinforced the importance of pumping on a consistent basis to help stimulate milk production.  Discussed using hand expression before/after pumping to help increase milk supply.  Suggested mother pump every three hours and she was willing to pump now.  #24 flange size is appropriate at this time.  Reviewed pump parts, assembly, disassembly with father.  Suggested mother get a "hands free" bra or use a sports bra to hold flanges while she performs breast compressions during pumping.  Mother seemed interested in using the sports bra idea.  Asked her to call lactation for any further questions/concerns she may have.  Encouraged pumping in the NICU and using our services once baby is able to begin latching.  Father present.  [place] referral faxed to [facility] to obtain a DEBP after discharge.   Maternal Data Formula Feeding for Exclusion: No Has patient been taught Hand Expression?: Yes Does the patient have breastfeeding experience prior to this delivery?: No  Feeding Feeding Type: Donor Breast Milk  LATCH Score                   Interventions    Lactation Tools Discussed/Used Pump Review: Setup, frequency, and cleaning;Milk Storage(Reviewed) Initiated by:: Paul Dykes Date initiated:: 03/15/19   Consult Status Consult Status: Follow-up Date: 03/15/19 Follow-up type: In-patient    Myalynn Lingle R Syrena Burges 03/14/2019, 12:50 PM

## 2019-03-14 NOTE — Progress Notes (Signed)
Placed pt on serial BPs due to elevated BP. Found pt in the shower crying. Educated pt on importance of staying in bed until BP is below severe range. Pt will not answer why is is crying. Emotional support provided

## 2019-03-14 NOTE — Anesthesia Postprocedure Evaluation (Signed)
Anesthesia Post Note  Patient: Lisa Sandoval  Procedure(s) Performed: CESAREAN SECTION (N/A )     Patient location during evaluation: PACU Anesthesia Type: General Level of consciousness: awake and alert Pain management: pain level controlled Vital Signs Assessment: post-procedure vital signs reviewed and stable Respiratory status: spontaneous breathing, nonlabored ventilation, respiratory function stable and patient connected to nasal cannula oxygen Cardiovascular status: blood pressure returned to baseline and stable Postop Assessment: no apparent nausea or vomiting Anesthetic complications: no    Last Vitals:  Vitals:   03/14/19 0410 03/14/19 0734  BP:  (!) 153/95  Pulse:  (!) 119  Resp:  18  Temp:  37 C  SpO2: 98% 99%    Last Pain:  Vitals:   03/14/19 0803  TempSrc:   PainSc: 10-Worst pain ever                 Chelsey L Woodrum

## 2019-03-14 NOTE — Progress Notes (Signed)
HA is much better with fiorcet.    Vitals:   03/14/19 0112 03/14/19 0409 03/14/19 0410 03/14/19 0734  BP: (!) 142/81 (!) 146/71  (!) 153/95  Pulse: (!) 112 (!) 121  (!) 119  Resp:  18  18  Temp:  98.7 F (37.1 C)  98.6 F (37 C)  TempSrc:  Oral  Oral  SpO2:  100% 98% 99%  Weight:      Height:       Results for orders placed or performed during the hospital encounter of 03/10/19 (from the past 24 hour(s))  CBC     Status: Abnormal   Collection Time: 03/13/19  6:39 PM  Result Value Ref Range   WBC 13.6 (H) 4.0 - 10.5 K/uL   RBC 3.37 (L) 3.87 - 5.11 MIL/uL   Hemoglobin 9.5 (L) 12.0 - 15.0 g/dL   HCT 30.1 (L) 36.0 - 46.0 %   MCV 89.3 80.0 - 100.0 fL   MCH 28.2 26.0 - 34.0 pg   MCHC 31.6 30.0 - 36.0 g/dL   RDW 14.5 11.5 - 15.5 %   Platelets 148 (L) 150 - 400 K/uL   nRBC 0.0 0.0 - 0.2 %  Comprehensive metabolic panel     Status: Abnormal   Collection Time: 03/13/19  6:39 PM  Result Value Ref Range   Sodium 138 135 - 145 mmol/L   Potassium 4.0 3.5 - 5.1 mmol/L   Chloride 111 98 - 111 mmol/L   CO2 20 (L) 22 - 32 mmol/L   Glucose, Bld 81 70 - 99 mg/dL   BUN 15 6 - 20 mg/dL   Creatinine, Ser 0.96 0.44 - 1.00 mg/dL   Calcium 8.1 (L) 8.9 - 10.3 mg/dL   Total Protein 4.6 (L) 6.5 - 8.1 g/dL   Albumin 2.0 (L) 3.5 - 5.0 g/dL   AST 19 15 - 41 U/L   ALT 23 0 - 44 U/L   Alkaline Phosphatase 55 38 - 126 U/L   Total Bilirubin 0.6 0.3 - 1.2 mg/dL   GFR calc non Af Amer >60 >60 mL/min   GFR calc Af Amer >60 >60 mL/min   Anion gap 7 5 - 15  CBC     Status: Abnormal   Collection Time: 03/14/19  8:45 AM  Result Value Ref Range   WBC 13.3 (H) 4.0 - 10.5 K/uL   RBC 3.38 (L) 3.87 - 5.11 MIL/uL   Hemoglobin 9.8 (L) 12.0 - 15.0 g/dL   HCT 29.6 (L) 36.0 - 46.0 %   MCV 87.6 80.0 - 100.0 fL   MCH 29.0 26.0 - 34.0 pg   MCHC 33.1 30.0 - 36.0 g/dL   RDW 14.6 11.5 - 15.5 %   Platelets 134 (L) 150 - 400 K/uL   nRBC 0.0 0.0 - 0.2 %  Comprehensive metabolic panel     Status: Abnormal   Collection Time: 03/14/19  8:45 AM  Result Value Ref Range   Sodium 137 135 - 145 mmol/L   Potassium 3.9 3.5 - 5.1 mmol/L   Chloride 110 98 - 111 mmol/L   CO2 20 (L) 22 - 32 mmol/L   Glucose, Bld 90 70 - 99 mg/dL   BUN 8 6 - 20 mg/dL   Creatinine, Ser 0.90 0.44 - 1.00 mg/dL   Calcium 8.4 (L) 8.9 - 10.3 mg/dL   Total Protein 4.8 (L) 6.5 - 8.1 g/dL   Albumin 2.0 (L) 3.5 - 5.0 g/dL   AST 27 15 - 41 U/L  ALT 26 0 - 44 U/L   Alkaline Phosphatase 63 38 - 126 U/L   Total Bilirubin 0.4 0.3 - 1.2 mg/dL   GFR calc non Af Amer >60 >60 mL/min   GFR calc Af Amer >60 >60 mL/min   Anion gap 7 5 - 15    Plts stable, Cr stable.  Continue current.

## 2019-03-14 NOTE — Progress Notes (Signed)
Contact With Dr. Philis Pique in regards to patient complaining of a headache on and off for most of the day which has been unrelieved with medication. New Orders given. Patient will be started back on Magnesium Sulfate. Toya Smothers, RN

## 2019-03-14 NOTE — Progress Notes (Signed)
4gram bolus of Magnesium sulfate infused followed by 2gms of Magnesium per hour. Pt states her headache is 10/10. Benadryl, phenergan, and decadron given to try and relieve headache. Pt is currently asleep. Toya Smothers, RN

## 2019-03-14 NOTE — Progress Notes (Signed)
Pt has had HA return for most of day without relief.  10/10.  HA cocktail give and pt now asleep.  Vitals:   03/14/19 1920 03/14/19 1931 03/14/19 1945 03/14/19 2000  BP: (!) 172/101 (!) 164/85 (!) 154/92 (!) 157/84  Pulse: 94 95 94 91  Resp:    20  Temp:    98 F (36.7 C)  TempSrc:    Oral  SpO2: 97%  96% 96%  Weight:      Height:        BP was severe range again tonight but came down without activating protocol and additional meds.  Currently on labetalol 800 mg pr tid and procardia 60 xl mg qd.    Given atypical postpartum course with continued severe headache I have elected to put her back on Magnesium Sulfate for seizure prophylaxis.    I also d/w pharmacist adding and additional agent- she recommended Enalapril 5 mg po qd day, which would still allow her to pump if she desired.    Will continue to work on keeping BPs below 150s/100s and trying to help her headache.

## 2019-03-14 NOTE — Progress Notes (Addendum)
  Patient is eating, ambulating, voiding.  Pain control is good for incision.  Vitals:   03/14/19 0112 03/14/19 0409 03/14/19 0410 03/14/19 0734  BP: (!) 142/81 (!) 146/71  (!) 153/95  Pulse: (!) 112 (!) 121  (!) 119  Resp:  18  18  Temp:  98.7 F (37.1 C)  98.6 F (37 C)  TempSrc:  Oral  Oral  SpO2:  100% 98% 99%  Weight:      Height:        lungs:  clear to auscultation cor:    RRR Abdomen:  soft, appropriate tenderness, incisions intact and without erythema or exudate ex:    no cords     --/--/A POS (10/04 2255)/RI  A/P    Post operative day 3 severe PIH with preterm C/S at 31 weeks.   Pt overnight had another set of severe range BPS.  Pt has been off Mag since  PO 1.  She is now on Labetalol 800 mg po TID and Procardia XL 60 mg qd.    BPs now better controlled but pt still has a 10/10 HA.    Percocet for pain control has not helped- giving pt fiorcet- if not resolved in an  hour, will place pt back on magnesium sulfate for seizure prophylaxis.  HA is  frontal so will also add Claritin.  If no resolution of HA, may also need to consider  imaging.     Cr has been elevated and plt were lower but not thromocytopenic- labs this am  are pending.

## 2019-03-15 NOTE — Lactation Note (Signed)
This note was copied from a baby's chart. Lactation Consultation Note  Patient Name: Lisa Sandoval [id number] Date: 03/15/2019 Reason for consult: Follow-up assessment;Preterm <34wks;Infant < 6lbs;Primapara;1st time breastfeeding;NICU baby  P1 mother whose infant is now 22 hours old.  This is a preterm infant at 30+1 weeks weighing < 6 lbs and in the NICU.  Mother has been following my recommendations from yesterday and pumping every three hours.  She denies pain with pumping.  Mother states she is obtaining "lots" of EBM now and requested some large bottles.  I provided these bottles for her and labels are at bedside.  Encouraged to continue as planned and praised mother for her dedication and consistent pumping schedule.  Mother happy to see this progress.    She has been in contact with the [facility] office since our discussion yesterday and will be able to pick up a DEBP after discharge.  She had no further questions/concerns at this time.  Father present.     Maternal Data    Feeding Feeding Type: Donor Breast Milk  LATCH Score                   Interventions    Lactation Tools Discussed/Used WIC Program: Yes   Consult Status Consult Status: Follow-up Date: 03/16/19 Follow-up type: In-patient    Lisa Sandoval 03/15/2019, 11:40 AM

## 2019-03-15 NOTE — Progress Notes (Signed)
Subjective: Postpartum Day 4: Cesarean Delivery Patient reports headache resolved, no other vision changes. Pain controlled  Objective: Vital signs in last 24 hours: Temp:  [98 F (36.7 C)-98.2 F (36.8 C)] 98.2 F (36.8 C) (10/09 0747) Pulse Rate:  [91-111] 103 (10/09 0747) Resp:  [18-20] 18 (10/09 0747) BP: (137-172)/(71-101) 137/81 (10/09 0747) SpO2:  [95 %-99 %] 99 % (10/09 0747)   Vitals:   03/15/19 0025 03/15/19 0100 03/15/19 0427 03/15/19 0747  BP:  (!) 147/71 138/79 137/81  Pulse:  (!) 109 100 (!) 103  Temp:   98 F (36.7 C) 98.2 F (36.8 C)  Resp:  18 18 18   Height:      Weight:      SpO2: 96% 96% 97% 99%  TempSrc:   Oral Oral  BMI (Calculated):         Physical Exam:  General: alert, cooperative and appears stated age 23: appropriate Uterine Fundus: firm Incision: healing well DVT Evaluation: No evidence of DVT seen on physical exam.  Recent Labs    03/13/19 1839 03/14/19 0845  HGB 9.5* 9.8*  HCT 30.1* 29.6*    Assessment/Plan: Status post Cesarean section. Doing well postoperatively.  Continue current care. Continue Mag for 24 hrs. Will evaluate BPs once discontinued. If headache returns once magnesium d/cd will proceed with head CT  Vanessa Kick 03/15/2019, 11:42 AM

## 2019-03-16 ENCOUNTER — Inpatient Hospital Stay (HOSPITAL_COMMUNITY): Payer: Medicaid Other

## 2019-03-16 MED ORDER — DIAZEPAM 5 MG PO TABS
10.0000 mg | ORAL_TABLET | Freq: Once | ORAL | Status: AC
  Administered 2019-03-16: 5 mg via ORAL
  Filled 2019-03-16: qty 2

## 2019-03-16 MED ORDER — GADOBUTROL 1 MMOL/ML IV SOLN
10.0000 mL | Freq: Once | INTRAVENOUS | Status: AC | PRN
  Administered 2019-03-16: 10 mL via INTRAVENOUS

## 2019-03-16 NOTE — Progress Notes (Signed)
Patient ID: Lisa Sandoval, female   DOB: Apr 23, 1996, 23 y.o.   MRN: [id number]   TC from radiology regarding Brain MRI. Findings show small subdermal hematoma. Neurosurgery consulted

## 2019-03-16 NOTE — Progress Notes (Signed)
Subjective: Postpartum Day 5: Cesarean Delivery Patient reports persistent headache only when she sits up or stands up. Headache is relieved by application of hot water on her head or laying flat. Pt states when she's in the shower she has no headache. Headache is unchanged from day of admission. It has not worsened since delivery.  She denies vision changes, nausea or vomiting.  Objective: Vital signs in last 24 hours: Temp:  [98 F (36.7 C)-99.5 F (37.5 C)] 98.6 F (37 C) (10/10 0753) Pulse Rate:  [90-101] 94 (10/10 0753) Resp:  [18-20] 18 (10/10 0753) BP: (113-150)/(57-86) 149/70 (10/10 0753) SpO2:  [98 %-100 %] 98 % (10/10 0753)  Physical Exam:  General: alert, cooperative and appears stated age 23: appropriate Uterine Fundus: firm Incision: healing well DVT Evaluation: No evidence of DVT seen on physical exam.  Recent Labs    03/13/19 1839 03/14/19 0845  HGB 9.5* 9.8*  HCT 30.1* 29.6*    Assessment/Plan: Status post Cesarean section. Doing well postoperatively.  Continue current care. Will proceed with head CT. Given headache has been present and unchanged since before the patient received her spinal feel spinal headache is less likely.  BPs much improved on labetalol 800mg  TID, Procardia xl 60mg  , and enalapril 5mg . Vanessa Kick 03/16/2019, 11:05 AM

## 2019-03-16 NOTE — Progress Notes (Signed)
Pt was ordered to take 10 mg of valium, but the pt only wanted to take 5 mg. Returned the other 5 mg to pharmacy and wasted it with pharmacist, Maree Krabbe.

## 2019-03-16 NOTE — Lactation Note (Addendum)
This note was copied from a baby's chart. Lactation Consultation Note  Patient Name: Girl Daryn Gosnell [id number] Date: 03/16/2019   Baby 67 days old in NICU.  [redacted]w[redacted]d. Mother pumping regularly and breastmilk has transitioned with supply increasing. Praised her for her efforts. Discussed bringing pump parts to NICU and pumping at bedside. Visit with mother PRN.     Maternal Data    Feeding    LATCH Score                   Interventions    Lactation Tools Discussed/Used     Consult Status      Carlye Grippe 03/16/2019, 1:54 PM

## 2019-03-16 NOTE — Consult Note (Signed)
Reason for Consult:bilateral subdural hematomas, headaches Referring Physician: Kashawn, Lisa is an 23 y.o. female.  HPI: whom secondary to positional headaches which preceded her delivery, postpartum day 5 Cesarean delivery, underwent a battery of MRI's, a venous study, angio, and plain. This revealed bilateral subdural hematomas. Asked to see Lisa Sandoval.  Past Medical History:  Diagnosis Date  . Essential hypertension affecting pregnancy, antepartum, third trimester     Past Surgical History:  Procedure Laterality Date  . BACK SURGERY    . CESAREAN SECTION N/A 03/11/2019   Procedure: CESAREAN SECTION;  Surgeon: Bobbye Charleston, MD;  Location: [facility];  Service: Obstetrics;  Laterality: N/A;    Family History  Problem Relation Age of Onset  . Diabetes Mother   . Diabetes Father     Social History:  reports that she has quit smoking. Her smoking use included cigarettes. She has never used smokeless tobacco. She reports that she does not drink alcohol or use drugs.  Allergies:  Allergies  Allergen Reactions  . Sulfa Antibiotics Hives    Medications: I have reviewed the patient's current medications.  No results found for this or any previous visit (from the past 48 hour(s)).  Mr Angio Head Wo Contrast  Result Date: 03/16/2019 CLINICAL DATA:  Postpartum day 5, suspected preeclampsia. Headache. Abnormal brain MRI showing subdural hematoma. Attempted spinal anesthesia. EXAM: MRA HEAD WITHOUT CONTRAST TECHNIQUE: Angiographic images of the Circle of Willis were obtained using MRA technique without intravenous contrast. COMPARISON:  MRI brain and MRV head road separately. FINDINGS: Patient motion results in misregistration, and reduced sensitivity. The internal carotid arteries are widely patent. The basilar artery is widely patent with vertebrals codominant. No visible intracranial stenosis, vascular occlusion, or saccular aneurysm. IMPRESSION: Motion degraded  exam.  Negative MRA intracranial circulation. Electronically Signed   By: Staci Righter M.D.   On: 03/16/2019 16:32   Mr Jeri Cos [id number] Contrast  Result Date: 03/16/2019 CLINICAL DATA:  Positional headache. History of preeclampsia. Postpartum Caesarean section day 5. Previous attempted spinal anesthesia. EXAM: MRI HEAD WITHOUT AND WITH CONTRAST TECHNIQUE: Multiplanar, multiecho pulse sequences of the brain and surrounding structures were obtained without and with intravenous contrast. CONTRAST:  14mL GADAVIST GADOBUTROL 1 MMOL/ML IV SOLN COMPARISON:  MRA and MRV reported separately. FINDINGS: Brain: 3-4 mm thick subdural hematoma along the RIGHT convexity, some extension into the interhemispheric compartment. 3 mm RIGHT-to-LEFT shift. No uncal herniation. Post infusion, symmetric pachymeningeal enhancement, likely related to recent lumbar puncture. Elsewhere in the brain, normal cerebral volume. No vasogenic edema or parenchymal hemorrhage to suggest preeclampsia or eclampsia. Generalized pituitary enlargement, not unexpected given the peripartum state. Vascular: Reported separately. Skull and upper cervical spine: Normal marrow signal. Sinuses/Orbits: Clear except for layering fluid in the RIGHT maxillary sinus. No orbital findings of significance. Other: None. IMPRESSION: 1. 3-4 mm thick acute RIGHT convexity subdural hematoma, some extension into the interhemispheric compartment. 3 mm RIGHT-to-LEFT shift. No uncal herniation. 2. No vasogenic edema or parenchymal hemorrhage to suggest preeclampsia or eclampsia. 3. Generalized pachymeningeal enhancement, likely related to recent lumbar puncture. 4. Generalized pituitary enlargement, not unexpected given the peripartum state. 5. Critical Value/emergent results were called by telephone at the time of interpretation on 03/16/2019 at 4:24 pm to Brandon , who verbally acknowledged these results. Electronically Signed   By: Staci Righter M.D.   On:  03/16/2019 16:25   Mr Mrv Miguel Dibble [id number] Contrast  Result Date: 03/16/2019 CLINICAL DATA:  Postpartum day 5  status post C-section. Headache. Acute subdural hematoma. Preeclampsia. EXAM: MR VENOGRAM HEAD WITHOUT AND WITH CONTRAST TECHNIQUE: Angiographic images of the intracranial venous structures were obtained using MRV technique without and with intravenous contrast. COMPARISON:  MRI brain and MRA reported separately. FINDINGS: The superior sagittal sinus is widely patent. The transverse sinuses are widely patent, LEFT dominant. Sigmoid sinuses and internal jugular veins widely patent. Internal cerebral veins widely patent. Vein of Galen widely patent. Straight sinus widely patent. No visible cortical venous thrombosis, although slight mass effect from the subdural collection on the RIGHT is observed. IMPRESSION: Negative MRV of the intracranial circulation. No evidence of sinovenous occlusive disease. Electronically Signed   By: Staci Righter M.D.   On: 03/16/2019 16:29    Review of Systems  Constitutional: Negative.   HENT:       Stuffed feeling in right ear  Eyes: Positive for blurred vision.  Respiratory: Negative.   Gastrointestinal: Negative.   Genitourinary: Negative.   Musculoskeletal: Negative.   Skin: Negative.   Neurological: Positive for headaches.  Endo/Heme/Allergies: Negative.   Psychiatric/Behavioral: Negative.    Blood pressure (!) 157/94, pulse 79, temperature 97.9 F (36.6 C), temperature source Oral, resp. rate 18, height 5\' 4"  (1.626 m), weight 135.1 kg, last menstrual period 03/16/2018, SpO2 100 %, unknown if currently breastfeeding. Physical Exam  Constitutional: She is oriented to person, place, and time. She appears well-developed and well-nourished. No distress.  HENT:  Head: Normocephalic and atraumatic.  Right Ear: External ear normal.  Left Ear: External ear normal.  Mouth/Throat: Oropharynx is clear and moist.  Eyes: Pupils are equal, round, and reactive to  light. Conjunctivae and EOM are normal.  Neck: Normal range of motion. Neck supple.  Cardiovascular: Normal rate.  Respiratory: Effort normal and breath sounds normal.  GI: Soft.  Neurological: She is alert and oriented to person, place, and time. She has normal strength and normal reflexes. No cranial nerve deficit or sensory deficit. Coordination normal. She displays no Babinski's sign on the right side. She displays no Babinski's sign on the left side.  No drift Gait not assessed Normal muscle tone and bulk    Assessment/Plan: Will observe subdurals at this time. No operative intervention anticipated. Nor can I state that this is the reason for her headaches, since they preceded delivery. She is neurologically normal. Will order a head CT for tomorrow. Call if changes, though once more, changes or deterioration is very unlikely.  Lisa Sandoval L 03/16/2019, 5:19 PM

## 2019-03-17 ENCOUNTER — Inpatient Hospital Stay (HOSPITAL_COMMUNITY): Payer: Medicaid Other

## 2019-03-17 MED ORDER — IBUPROFEN 600 MG PO TABS: 600.0000 mg | ORAL_TABLET | Freq: Four times a day (QID) | ORAL | 0 refills | Status: AC | PRN

## 2019-03-17 MED ORDER — LABETALOL HCL 200 MG PO TABS: 800.0000 mg | ORAL_TABLET | Freq: Three times a day (TID) | ORAL | 1 refills | Status: AC

## 2019-03-17 MED ORDER — NIFEDIPINE ER 60 MG PO TB24: 60.0000 mg | ORAL_TABLET | Freq: Every day | ORAL | 1 refills | Status: AC

## 2019-03-17 MED ORDER — ENALAPRIL MALEATE 5 MG PO TABS: 5.0000 mg | ORAL_TABLET | Freq: Every day | ORAL | 1 refills | Status: AC

## 2019-03-17 NOTE — Discharge Summary (Signed)
Obstetric Discharge Summary Reason for Admission: chronic hypertension with superimposed pre-eclampsia Prenatal Procedures: NST and ultrasound Intrapartum Procedures: cesarean: low cervical, transverse Postpartum Procedures: Brain MRI/MRA/MRV, Head CT, Neurosurgery consult Complications-Operative and Postpartum: Subdural hematoma Hemoglobin  Date Value Ref Range Status  03/14/2019 9.8 (L) 12.0 - 15.0 g/dL Final   HCT  Date Value Ref Range Status  03/14/2019 29.6 (L) 36.0 - 46.0 % Final    Physical Exam:  General: alert, cooperative and appears stated age 23: appropriate Uterine Fundus: firm Incision: healing well DVT Evaluation: No evidence of DVT seen on physical exam.  Discharge Diagnoses: Chronic hypertension with superimporsed pre-eclampsia, Indicated pre-term delivery due to superimposed pre-eclamsia, BP control, cesarean delivery, subdural hematoma  Discharge Information: Date: 03/17/2019 Activity: pelvic rest Diet: routine Medications: labetalol, procardia, enalapril,  Condition: improved Instructions: refer to practice specific booklet Discharge to: home Follow-up Information    Ashok Pall, MD Follow up in 2 week(s).   Specialty: Neurosurgery Why: please call to make an appointment Contact information: [address] [place] [address] [phone]        Bobbye Charleston, MD Follow up in 1 week(s).   Specialty: Obstetrics and Gynecology Why: for incision and BP check Contact information: [place] [address] [phone]           Newborn Data: Live born female  Birth Weight: 2 lb 13.2 oz (1280 g) APGAR: 4, 8  Newborn Delivery   Birth date/time: 03/11/2019 12:13:00 Delivery type: C-Section, Low Transverse Trial of labor: Yes C-section categorization: Primary      Home with mother.  Vanessa Kick 03/17/2019, 12:32 PM

## 2019-03-17 NOTE — Progress Notes (Signed)
Patient ID: Lisa Sandoval, female   DOB: 10/16/1995, 23 y.o.   MRN: [id number] [facility] for discharge from my standpoint.  Alert, oriented x 4 Moving all extremities well

## 2019-03-17 NOTE — Progress Notes (Signed)
Discharge instructions and prescriptions given to pt. Discussed post c-section care, subdural hematoma, signs and symptoms to report to the MD, upcoming appointments, and meds. Pt verbalizes understanding and has no questions or concerns at this time.Pt discharged from hospital in stable condition.

## 2019-03-17 NOTE — Progress Notes (Signed)
Patient ID: Lisa Sandoval, female   DOB: April 22, 1996, 23 y.o.   MRN: [id number] BP (!) 151/96 (BP Location: Left Arm) Comment: RN notified  Pulse 78   Temp 98.3 F (36.8 C) (Oral)   Resp 17   Ht 5\' 4"  (1.626 m)   Wt 135.1 kg   LMP 03/16/2018   SpO2 98%   Breastfeeding Unknown   BMI 51.12 kg/m  Films reviewed. No change. Will see patient later this morning

## 2019-03-17 NOTE — Progress Notes (Signed)
Subjective: Postpartum Day 5: Cesarean Delivery Patient reports still having headache. MRI shows subdural hematoma, CT confirms no change since yesterday's imaging. Otherwise, no other sxs.  Objective: Vital signs in last 24 hours: Temp:  [97.8 F (36.6 C)-98.3 F (36.8 C)] 98.3 F (36.8 C) (10/11 0829) Pulse Rate:  [78-88] 78 (10/11 0829) Resp:  [16-20] 17 (10/11 0829) BP: (133-157)/(71-96) 151/96 (10/11 0829) SpO2:  [98 %-100 %] 98 % (10/11 0829)  Physical Exam:  General: alert, cooperative and appears stated age Lochia: appropriate Uterine Fundus: firm Incision: healing well DVT Evaluation: No evidence of DVT seen on physical exam.  No results for input(s): HGB, HCT in the last 72 hours.  Assessment/Plan: Status post Cesarean section. Doing well postoperatively.  Continue current care. BPs overall controlled, no severe range. On Labetalol 800mg  TID, Procardia 60XL, enalapril 5mg . May need to increase enalapril.  Uncertain duration of hospitalization required for subdural hematoma. Appreciate the assistance from Dr. Christella Noa with neurosurgery. Lisa Sandoval 03/17/2019, 10:52 AM

## 2019-03-18 ENCOUNTER — Inpatient Hospital Stay (HOSPITAL_COMMUNITY)
Admission: AD | Admit: 2019-03-18 | Discharge: 2019-03-18 | Disposition: A | Discharge status: Home / Self Care | Payer: Medicaid Other | Attending: Obstetrics | Admitting: Obstetrics

## 2019-03-18 ENCOUNTER — Other Ambulatory Visit: Payer: Self-pay

## 2019-03-18 ENCOUNTER — Inpatient Hospital Stay (HOSPITAL_COMMUNITY): Payer: Medicaid Other | Admitting: Anesthesiology

## 2019-03-18 ENCOUNTER — Encounter (HOSPITAL_COMMUNITY): Payer: Self-pay

## 2019-03-18 DIAGNOSIS — O9089 Other complications of the puerperium, not elsewhere classified: Secondary | ICD-10-CM | POA: Diagnosis not present

## 2019-03-18 DIAGNOSIS — R519 Headache, unspecified: Secondary | ICD-10-CM | POA: Diagnosis present

## 2019-03-18 DIAGNOSIS — I10 Essential (primary) hypertension: Secondary | ICD-10-CM | POA: Insufficient documentation

## 2019-03-18 DIAGNOSIS — G4489 Other headache syndrome: Secondary | ICD-10-CM | POA: Diagnosis not present

## 2019-03-18 DIAGNOSIS — Z87891 Personal history of nicotine dependence: Secondary | ICD-10-CM | POA: Diagnosis not present

## 2019-03-18 DIAGNOSIS — I6200 Nontraumatic subdural hemorrhage, unspecified: Secondary | ICD-10-CM | POA: Diagnosis not present

## 2019-03-18 DIAGNOSIS — G971 Other reaction to spinal and lumbar puncture: Secondary | ICD-10-CM

## 2019-03-18 MED ORDER — LABETALOL HCL 5 MG/ML IV SOLN
INTRAVENOUS | Status: DC | PRN
  Administered 2019-03-18: 20 mg via INTRAVENOUS

## 2019-03-18 MED ORDER — MIDAZOLAM HCL 2 MG/2ML IJ SOLN
INTRAMUSCULAR | Status: DC | PRN
  Administered 2019-03-18: 2 mg via INTRAVENOUS

## 2019-03-18 MED ORDER — LABETALOL HCL 300 MG PO TABS
800.0000 mg | ORAL_TABLET | Freq: Once | ORAL | Status: AC
  Administered 2019-03-18: 800 mg via ORAL
  Filled 2019-03-18: qty 1

## 2019-03-18 MED ORDER — MIDAZOLAM HCL 2 MG/2ML IJ SOLN
INTRAMUSCULAR | Status: AC
  Filled 2019-03-18: qty 2

## 2019-03-18 MED ORDER — LACTATED RINGERS IV SOLN
INTRAVENOUS | Status: DC | PRN
  Administered 2019-03-18: 14:00:00 via INTRAVENOUS

## 2019-03-18 MED ORDER — FENTANYL CITRATE (PF) 100 MCG/2ML IJ SOLN
INTRAMUSCULAR | Status: AC
  Filled 2019-03-18: qty 2

## 2019-03-18 MED ORDER — LABETALOL HCL 5 MG/ML IV SOLN
INTRAVENOUS | Status: AC
  Filled 2019-03-18: qty 4

## 2019-03-18 MED ORDER — FENTANYL CITRATE (PF) 100 MCG/2ML IJ SOLN
INTRAMUSCULAR | Status: DC | PRN
  Administered 2019-03-18: 100 ug via INTRAVENOUS

## 2019-03-18 MED ORDER — MIDAZOLAM HCL 2 MG/2ML IJ SOLN: 2.0000 mg | Freq: Once | INTRAMUSCULAR | Status: DC | PRN

## 2019-03-18 MED ORDER — FENTANYL CITRATE (PF) 100 MCG/2ML IJ SOLN: 100.0000 ug | Freq: Once | INTRAMUSCULAR | Status: DC | PRN

## 2019-03-18 NOTE — Anesthesia Procedure Notes (Signed)
Epidural  Start time: 03/18/2019 1:50 PM End time: 03/18/2019 2:05 PM  Staffing Anesthesiologist: Josephine Igo, MD Performed: anesthesiologist   Preanesthetic Checklist Completed: patient identified, site marked, surgical consent, pre-op evaluation, timeout performed, IV checked, risks and benefits discussed and monitors and equipment checked  Epidural Patient position: sitting Prep: DuraPrep Patient monitoring: heart rate, cardiac monitor, continuous pulse ox and blood pressure Approach: midline Location: L4-L5 Injection technique: LOR air  Needle:  Needle type: Tuohy  Needle gauge: 17 G Needle length: 9 cm Needle insertion depth: 8 cm  Additional Notes After adequate IV access obtained, the patient was placed in the sitting position. Area of previous attempt at SAB was identified and overlying skin was prepped with Duraprep and sterile drapes were applied. The epidural space was ID'd using a 17ga Touhy needle. Small amount of CSF noted in the hub of the Touhy needle. 42ml of whole blood withdrawn from right AC vein in a sterile fashion an injected into theTouhy needle. The epidural needle was then withdrawn and a sterile dressing was appled. The patient was then placed supine and reported relief of her HA. She did have some tightness across both hips.Reason for block:Epidural Blood Patch

## 2019-03-18 NOTE — Transfer of Care (Signed)
Immediate Anesthesia Transfer of Care Note  Patient: Lisa Sandoval  Procedure(s) Performed: AN AD HOC BLOOD PATCH  Patient Location: PACU and MAU  Anesthesia Type:MAC  Level of Consciousness: awake, alert , oriented and patient cooperative  Airway & Oxygen Therapy: Patient Spontanous Breathing  Post-op Assessment: Report given to RN and Post -op Vital signs reviewed and stable  Post vital signs: Reviewed and stable  Last Vitals:  Vitals Value Taken Time  BP    Temp    Pulse    Resp    SpO2      Last Pain:  Vitals:   03/18/19 1123  PainSc: 10-Worst pain ever         Complications: No apparent anesthesia complications

## 2019-03-18 NOTE — Anesthesia Preprocedure Evaluation (Signed)
Anesthesia Evaluation  Patient identified by MRN, date of birth, ID band Patient awake    Reviewed: Allergy & Precautions, NPO status , Patient's Chart, lab work & pertinent test results, reviewed documented beta blocker date and time   Airway Mallampati: III  TM Distance: >3 FB Neck ROM: Full    Dental no notable dental hx. (+) Teeth Intact   Pulmonary former smoker,    Pulmonary exam normal breath sounds clear to auscultation       Cardiovascular hypertension, Pt. on medications and Pt. on home beta blockers  Rhythm:Regular Rate:Normal     Neuro/Psych  Headaches, Anxiety Patient has positional HA accompanied by photophobia, N/V, nuchal rigidity which has gotten progressively worse since discharge. HA gets significantly better when she assumes the supine position. She had a CT scan yesterday which revealed a small 71mm subdural hematoma right cerebral convexity which is probably related to her severe pre eclampsia superimposed on cHTN. She was known to have a dural puncture with a 17 Ga Touhy needle while attempting to do SAB at the time of her C/Section which was 7 days ago. Her HA seems to be more related to the dural puncture than her subdural hematoma.      GI/Hepatic negative GI ROS, Neg liver ROS,   Endo/Other  Morbid obesity  Renal/GU negative Renal ROS  negative genitourinary   Musculoskeletal negative musculoskeletal ROS (+)   Abdominal (+) + obese,   Peds  Hematology   Anesthesia Other Findings   Reproductive/Obstetrics 7 days post partum C/Section for severe pre eclampsia with cHTN. She is lactating.                             Anesthesia Physical Anesthesia Plan  ASA: III  Anesthesia Plan: Epidural   Post-op Pain Management:    Induction:   PONV Risk Score and Plan:   Airway Management Planned: Natural Airway  Additional Equipment:   Intra-op Plan:   Post-operative  Plan:   Informed Consent: I have reviewed the patients History and Physical, chart, labs and discussed the procedure including the risks, benefits and alternatives for the proposed anesthesia with the patient or authorized representative who has indicated his/her understanding and acceptance.       Plan Discussed with: Anesthesiologist  Anesthesia Plan Comments: (Plan is to do a Lumbar Epidural Blood patch.)        Anesthesia Quick Evaluation

## 2019-03-18 NOTE — Anesthesia Postprocedure Evaluation (Signed)
Anesthesia Post Note  Patient: Lisa Sandoval  Procedure(s) Performed: AN AD HOC BLOOD PATCH     Patient location during evaluation: PACU Anesthesia Type: Epidural Level of consciousness: awake and alert and oriented Pain management: pain level controlled Vital Signs Assessment: post-procedure vital signs reviewed and stable Respiratory status: spontaneous breathing, nonlabored ventilation and respiratory function stable Cardiovascular status: blood pressure returned to baseline and stable Postop Assessment: no headache, able to ambulate and no apparent nausea or vomiting Anesthetic complications: no Comments: HA is minimal after LEBP. Stable to go home. F/U with Dr. Carlis Abbott on Wednesday in office. Patient to call tomorrow for appointment. Return to  ER if worsening neurological signs or HA returns.    Last Vitals:  Vitals:   03/18/19 1212 03/18/19 1230  BP: 137/77 140/87  Pulse: 87 90  Resp:    Temp:    SpO2:      Last Pain:  Vitals:   03/18/19 1123  PainSc: 10-Worst pain ever   Pain Goal:                   Kilee Hedding A.

## 2019-03-18 NOTE — MAU Note (Signed)
Pt assisted to wheelchair and taken out to car for discharge with her mother. All discharge explained by Dr Royce Macadamia , pt questions encouraged and answered

## 2019-03-18 NOTE — MAU Provider Note (Addendum)
Patient  Lisa Sandoval is a 23 y.o. [phone] who is here with a positional headache, light sensitivity, neck stiffness, imbalance. This positional headache, light sensitivity, neck stiffness and imbalance all started with the epidural on 10/5. She says that it has gotten worse.  She was told that she had 2 subdural hematomas and that the symptoms would continue until the hematoma is resolved. She states that she felt "horrible" yesterday but was discharged regardless. She says that the Naproxen does not help, niether does the headache cocktail.  Patient says her patient was unbearable yesterday and it is still unbearable. She appears in distress; lying in the dark on her side.   Her ob history is significant for SIPE and c-section at 30 weeks on 10/5.  History     CSN: [id number]  Arrival date and time: 03/18/19 1115   None     Chief Complaint  Patient presents with  . Headache   Headache  This is a new problem. The current episode started in the past 7 days. Radiates to: neck stiffness. The quality of the pain is described as pulsating. The pain is at a severity of 10/10. Associated symptoms include a loss of balance and photophobia. Pertinent negatives include no abnormal behavior, blurred vision, fever or vomiting. Associated symptoms comments: No vomiting today but vomiting yesterday. Neck stiffness. . The symptoms are aggravated by bright light (positional ).    OB History    Gravida  2   Para  1   Term      Preterm  1   AB  1   Living  1     SAB  1   TAB      Ectopic      Multiple  0   Live Births  1           Past Medical History:  Diagnosis Date  . Essential hypertension affecting pregnancy, antepartum, third trimester     Past Surgical History:  Procedure Laterality Date  . BACK SURGERY    . CESAREAN SECTION N/A 03/11/2019   Procedure: CESAREAN SECTION;  Surgeon: Bobbye Charleston, MD;  Location: [facility];  Service: Obstetrics;  Laterality: N/A;     Family History  Problem Relation Age of Onset  . Diabetes Mother   . Diabetes Father     Social History   Tobacco Use  . Smoking status: Former Smoker    Types: Cigarettes  . Smokeless tobacco: Never Used  Substance Use Topics  . Alcohol use: No  . Drug use: No    Allergies:  Allergies  Allergen Reactions  . Sulfa Antibiotics Hives    Medications Prior to Admission  Medication Sig Dispense Refill Last Dose  . acetaminophen (TYLENOL) 500 MG tablet Take 500 mg by mouth every 6 (six) hours as needed for mild pain or headache.     . enalapril (VASOTEC) 5 MG tablet Take 1 tablet (5 mg total) by mouth daily. 30 tablet 1   . ibuprofen (ADVIL) 600 MG tablet Take 1 tablet (600 mg total) by mouth every 6 (six) hours as needed. 90 tablet 0   . labetalol (NORMODYNE) 200 MG tablet Take 4 tablets (800 mg total) by mouth 3 (three) times daily. 360 tablet 1   . NIFEdipine (ADALAT CC) 60 MG 24 hr tablet Take 1 tablet (60 mg total) by mouth daily. 30 tablet 1     Review of Systems  Constitutional: Negative for fever.  HENT: Negative.  Eyes: Positive for photophobia. Negative for blurred vision.  Gastrointestinal: Negative.  Negative for vomiting.  Musculoskeletal: Negative.   Neurological: Positive for headaches and loss of balance.  Hematological: Negative.   Psychiatric/Behavioral: Negative.    Physical Exam   Blood pressure (!) 145/89, pulse (!) 105, temperature 98 F (36.7 C), resp. rate 16, last menstrual period 03/16/2018, SpO2 100 %, unknown if currently breastfeeding.  Physical Exam  Constitutional: She appears well-developed.  Respiratory: Effort normal.  Neurological: She is alert.  Skin: Skin is warm and dry.  Psychiatric: She has a normal mood and affect.   Patient is lying on her right side in the bed, lights are off. Patient is extremely sensitive to movement, therefore, full physical exam deferred.  MAU Course  Procedures  MDM -consult to neurology, who  advises that may be necessary to repeat CT, otherwise it is safe to do an epidural blood patch. Recommendation from neurology communicated to Dr. Royce Macadamia, who assumes care of this patient.   Assessment and Plan  -Dr. Royce Macadamia assumes care of patient.     Mervyn Skeeters [place]grusa 03/18/2019, 11:34 AM

## 2019-03-18 NOTE — MAU Note (Addendum)
.   Lisa Sandoval is a 23 y.o. at [redacted]w[redacted]d here in MAU reporting: severe headache with movement. Pt delivered by C/S on October the 5th at [redacted] weeks gestation. Mother states that she loses her balance at home in severe pain  Mother states she had a CT and she has subdural hematomas  Onset of complaint: October the 4th Pain score: 10 Vitals:   03/18/19 1124  BP: (!) 145/89  Pulse: (!) 105  Resp: 16  Temp: 98 F (36.7 C)  SpO2: 100%      Lab orders placed from triage: UA

## 2019-03-18 NOTE — Consult Note (Signed)
Interim note  Called regarding Lisa Sandoval for 10/10 headache which was apparently positional. Reviewed chart, presented on Oct 10 with headache, CT head showed bilateral subdural hemorrhages. NS consulted, recommended repeat CT head, no intervention. Patient also presented earlier with preeclampsia with BP is 180s prior to delivery.  MRI brain and MRV negative performed on 10/10 for PRES/venous thrombosis. MRA negative for dissection  Recommended to repeat CT head today to make sure SDH had not worsened. Went to evaluate patient, already in OR receiving blood patch.   If her headache is positional, likely it is Post epidural headache 2/2 CSF leak and resulting B/L SDH. Agree she would require blood patch.

## 2019-03-18 NOTE — MAU Note (Signed)
TO PACU to talk with Dr Royce Macadamia in regards to pt's discharge. Pt awake and talking, pain 0, states head feels much better.

## 2019-05-29 ENCOUNTER — Encounter: Payer: Self-pay | Admitting: Family Medicine

## 2019-05-29 ENCOUNTER — Other Ambulatory Visit: Payer: Self-pay

## 2019-05-29 ENCOUNTER — Ambulatory Visit (INDEPENDENT_AMBULATORY_CARE_PROVIDER_SITE_OTHER): Payer: Medicaid Other | Admitting: Family Medicine

## 2019-05-29 VITALS — BP 123/87 | HR 81 | Temp 98.1°F | Ht 64.0 in | Wt 243.0 lb

## 2019-05-29 DIAGNOSIS — T3995XA Adverse effect of unspecified nonopioid analgesic, antipyretic and antirheumatic, initial encounter: Secondary | ICD-10-CM

## 2019-05-29 DIAGNOSIS — G4440 Drug-induced headache, not elsewhere classified, not intractable: Secondary | ICD-10-CM | POA: Insufficient documentation

## 2019-05-29 DIAGNOSIS — G4489 Other headache syndrome: Secondary | ICD-10-CM

## 2019-05-29 MED ORDER — PREDNISONE 10 MG (48) PO TBPK: ORAL_TABLET | ORAL | 0 refills | Status: AC

## 2019-05-29 MED FILL — predniSONE 10 MG (48) TBPK: 10 | 12 days supply | Qty: 48 | Fill #0

## 2019-05-29 NOTE — Progress Notes (Signed)
Established Patient Office Visit  Subjective:  Patient ID: Lisa Sandoval, female    DOB: 06-01-1996  Age: 23 y.o. MRN: [id number]  CC:  Chief Complaint  Patient presents with  . Headache    pt is c/o of constant headache--going since she gave birth 3 mo ago--pt is taking ibuprofen but not helping/ FYI--pt mention she was on blood pactch 1 wk after child birth; not breast feeding.     HPI HAMSIKA MILAN presents for treatment at an evaluation while an ongoing headache syndrome since parturition 3 months ago.  Patient had received an epidural and subsequent blood patch.  Headache is improved since it first started with the birth of her daughter but it lingers and persists.  It is daily.  It comes and goes throughout the day.  Patient describes a throbbing area on the top of her cranium just to the right as well as in the right temporal area.  She has been treating it with alternating doses of ibuprofen and Tylenol taken on a daily basis.  She denies any nasal congestion postnasal drip facial pressure or teeth pain or rhinorrhea.  There is been no fever chills rash neck stiffness.  She has no history of headache.  She has been feeling well otherwise.  There is been no prodromal symptomatology.  There is been no nausea or vomiting.  Baby is doing well at home mother.  Baby had been in the NICU for a period of time.  Patient's pregnancy was complicated by preeclampsia.  She remains on labetalol.  Her blood pressures at their highest has been 140/90.  They are usually much lower than that.  She expects to be able to discontinue the labetalol soon.  Past Medical History:  Diagnosis Date  . Essential hypertension affecting pregnancy, antepartum, third trimester     Past Surgical History:  Procedure Laterality Date  . BACK SURGERY    . CESAREAN SECTION N/A 03/11/2019   Procedure: CESAREAN SECTION;  Surgeon: Bobbye Charleston, MD;  Location: [facility];  Service: Obstetrics;  Laterality: N/A;     Family History  Problem Relation Age of Onset  . Diabetes Mother   . Diabetes Father     Social History   Socioeconomic History  . Marital status: Single    Spouse name: Not on file  . Number of children: Not on file  . Years of education: Not on file  . Highest education level: Not on file  Occupational History  . Not on file  Tobacco Use  . Smoking status: Former Smoker    Types: Cigarettes  . Smokeless tobacco: Never Used  Substance and Sexual Activity  . Alcohol use: No  . Drug use: No  . Sexual activity: Yes  Other Topics Concern  . Not on file  Social History Narrative  . Not on file   Social Determinants of Health   Financial Resource Strain:   . Difficulty of Paying Living Expenses: Not on file  Food Insecurity:   . Worried About Charity fundraiser in the Last Year: Not on file  . Ran Out of Food in the Last Year: Not on file  Transportation Needs:   . Lack of Transportation (Medical): Not on file  . Lack of Transportation (Non-Medical): Not on file  Physical Activity:   . Days of Exercise per Week: Not on file  . Minutes of Exercise per Session: Not on file  Stress:   . Feeling of Stress : Not  on file  Social Connections:   . Frequency of Communication with Friends and Family: Not on file  . Frequency of Social Gatherings with Friends and Family: Not on file  . Attends Religious Services: Not on file  . Active Member of Clubs or Organizations: Not on file  . Attends Archivist Meetings: Not on file  . Marital Status: Not on file  Intimate Partner Violence:   . Fear of Current or Ex-Partner: Not on file  . Emotionally Abused: Not on file  . Physically Abused: Not on file  . Sexually Abused: Not on file    Outpatient Medications Prior to Visit  Medication Sig Dispense Refill  . acetaminophen (TYLENOL) 500 MG tablet Take 500 mg by mouth every 6 (six) hours as needed for mild pain or headache.    . ibuprofen (ADVIL) 600 MG tablet Take  1 tablet (600 mg total) by mouth every 6 (six) hours as needed. 90 tablet 0  . labetalol (NORMODYNE) 200 MG tablet Take 4 tablets (800 mg total) by mouth 3 (three) times daily. 360 tablet 1  . enalapril (VASOTEC) 5 MG tablet Take 1 tablet (5 mg total) by mouth daily. (Patient not taking: Reported on 05/29/2019) 30 tablet 1  . NIFEdipine (ADALAT CC) 60 MG 24 hr tablet Take 1 tablet (60 mg total) by mouth daily. (Patient not taking: Reported on 05/29/2019) 30 tablet 1   No facility-administered medications prior to visit.    Allergies  Allergen Reactions  . Sulfa Antibiotics Hives    ROS Review of Systems  Constitutional: Negative for chills, diaphoresis, fatigue, fever and unexpected weight change.  HENT: Negative for congestion, postnasal drip, rhinorrhea, sinus pressure, sinus pain and sore throat.   Eyes: Negative for photophobia and visual disturbance.  Respiratory: Negative.   Cardiovascular: Negative.   Gastrointestinal: Negative for nausea and vomiting.  Genitourinary: Negative.   Musculoskeletal: Negative for arthralgias and gait problem.  Skin: Negative for pallor and rash.  Allergic/Immunologic: Negative for immunocompromised state.  Neurological: Positive for headaches. Negative for seizures, speech difficulty, weakness and light-headedness.  Hematological: Does not bruise/bleed easily.  Psychiatric/Behavioral: Negative.       Objective:    Physical Exam  Constitutional: She is oriented to person, place, and time. She appears well-developed and well-nourished. No distress.  HENT:  Head: Normocephalic and atraumatic.  Neurological: She is alert and oriented to person, place, and time.  Skin: She is not diaphoretic.  Psychiatric: She has a normal mood and affect. Her behavior is normal.    BP 123/87 Comment: pt reprt  Pulse 81 Comment: pt reprt  Temp 98.1 F (36.7 C) (Oral)   Ht 5\' 4"  (1.626 m)   Wt 243 lb (110.2 kg) Comment: pt report  Breastfeeding No   BMI  41.71 kg/m  Wt Readings from Last 3 Encounters:  05/29/19 243 lb (110.2 kg)  03/10/19 297 lb 12.8 oz (135.1 kg)  03/05/19 288 lb (130.6 kg)     Health Maintenance Due  Topic Date Due  . PAP-Cervical Cytology Screening  03/29/2018  . PAP SMEAR-Modifier  03/29/2018  . INFLUENZA VACCINE  01/05/2019    There are no preventive care reminders to display for this patient.  Lab Results  Component Value Date   TSH 2.31 06/14/2018   Lab Results  Component Value Date   WBC 13.3 (H) 03/14/2019   HGB 9.8 (L) 03/14/2019   HCT 29.6 (L) 03/14/2019   MCV 87.6 03/14/2019   PLT 134 (L) 03/14/2019  Lab Results  Component Value Date   NA 137 03/14/2019   K 3.9 03/14/2019   CO2 20 (L) 03/14/2019   GLUCOSE 90 03/14/2019   BUN 8 03/14/2019   CREATININE 0.90 03/14/2019   BILITOT 0.4 03/14/2019   ALKPHOS 63 03/14/2019   AST 27 03/14/2019   ALT 26 03/14/2019   PROT 4.8 (L) 03/14/2019   ALBUMIN 2.0 (L) 03/14/2019   CALCIUM 8.4 (L) 03/14/2019   ANIONGAP 7 03/14/2019   GFR 93.93 10/20/2017   Lab Results  Component Value Date   CHOL 162 10/20/2017   Lab Results  Component Value Date   HDL 42.80 10/20/2017   Lab Results  Component Value Date   LDLCALC 96 10/20/2017   Lab Results  Component Value Date   TRIG 118.0 10/20/2017   Lab Results  Component Value Date   CHOLHDL 4 10/20/2017   No results found for: HGBA1C    Assessment & Plan:   Problem List Items Addressed This Visit      Other   Analgesic rebound headache - Primary   Relevant Medications   predniSONE (STERAPRED UNI-PAK 48 TAB) 10 MG (48) TBPK tablet   Other headache syndrome   Relevant Medications   predniSONE (STERAPRED UNI-PAK 48 TAB) 10 MG (48) TBPK tablet      Meds ordered this encounter  Medications  . predniSONE (STERAPRED UNI-PAK 48 TAB) 10 MG (48) TBPK tablet    Sig: Pharm to dose a 12 day dose pack.    Dispense:  48 tablet    Refill:  0    Follow-up: Return if symptoms worsen or fail to  improve.   Patient will take a 12-day Dosepak for treatment of analgesic rebound headache.  We discussed prednisone at length with its side effects of increased appetite irritability and so forth.  She will take evening doses towards the afternoon if sleep is affected.  She will call with any difficulties with the prednisone.  She will monitor her pressure and let me know if it starts to average 150/90. Libby Maw, MD   Virtual Visit via Video Note  I connected with [name] on 05/29/19 at 11:00 AM EST by a video enabled telemedicine application and verified that I am speaking with the correct person using two identifiers.  Location: Patient: home with baby Provider   I discussed the limitations of evaluation and management by telemedicine and the availability of in person appointments. The patient expressed understanding and agreed to proceed.  History of Present Illness:    Observations/Objective:   Assessment and Plan:   Follow Up Instructions:    I discussed the assessment and treatment plan with the patient. The patient was provided an opportunity to ask questions and all were answered. The patient agreed with the plan and demonstrated an understanding of the instructions.   The patient was advised to call back or seek an in-person evaluation if the symptoms worsen or if the condition fails to improve as anticipated.  I provided 20 minutes of non-face-to-face time during this encounter.   Libby Maw, MD

## 2019-12-27 IMAGING — CT CT HEAD W/O CM
4 series · 16 of 47 positions shown, 18 images · non-contrast
Comparison: Head MRI 03/16/2019

CLINICAL DATA: Subdural hematoma. Headache following recent
cesarean section.

EXAM:
CT HEAD WITHOUT CONTRAST
TECHNIQUE: Contiguous axial images were obtained from the base of the skull
through the vertex without intravenous contrast.

[Series 2: head wo · axial · 0.46mm/px · z∈[-84,+32]mm · 7 of 31 slices shown, 9 images]
[im 4/31  brain]
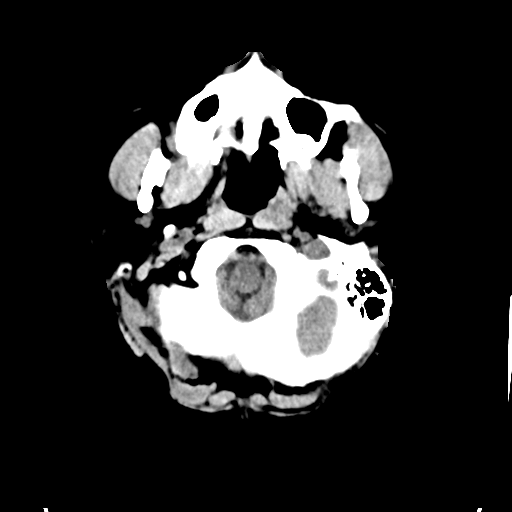
[im 4/31  bone]
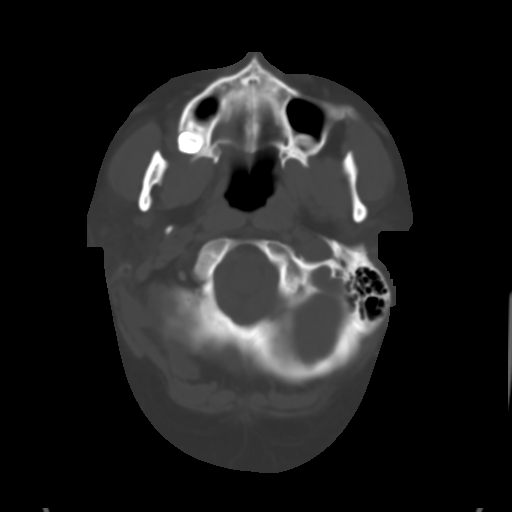
[im 8/31  brain]
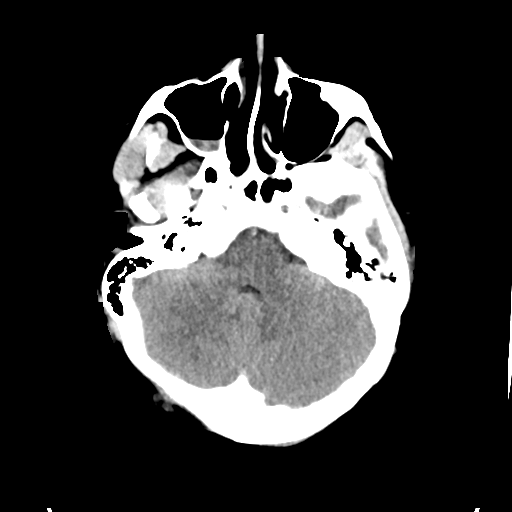
[im 12/31  brain]
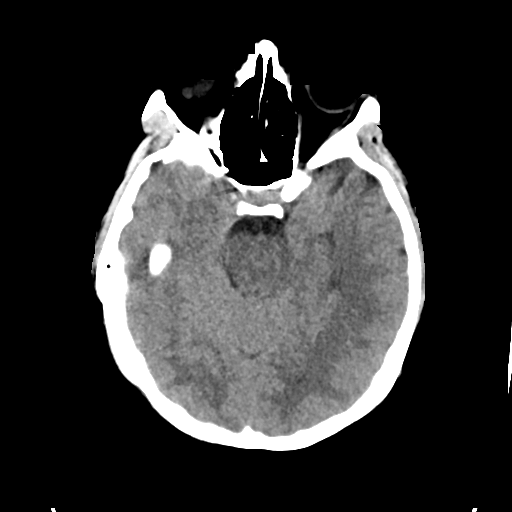
[im 16/31  brain]
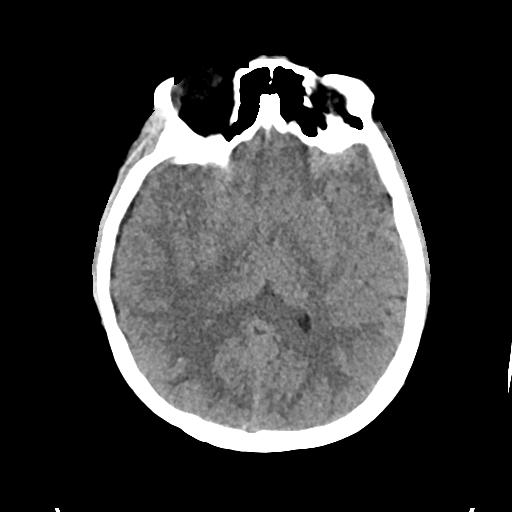
[im 19/31  brain]
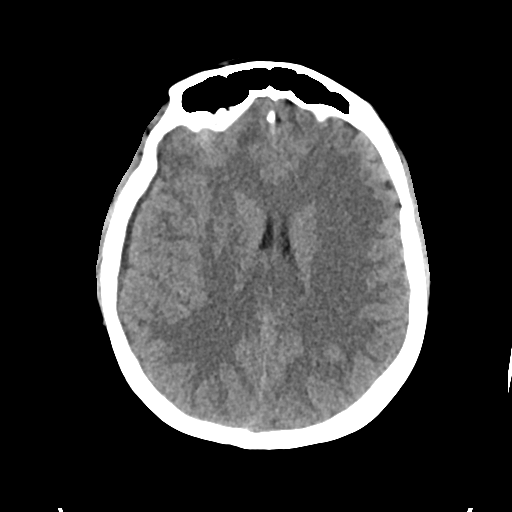
[im 19/31  bone]
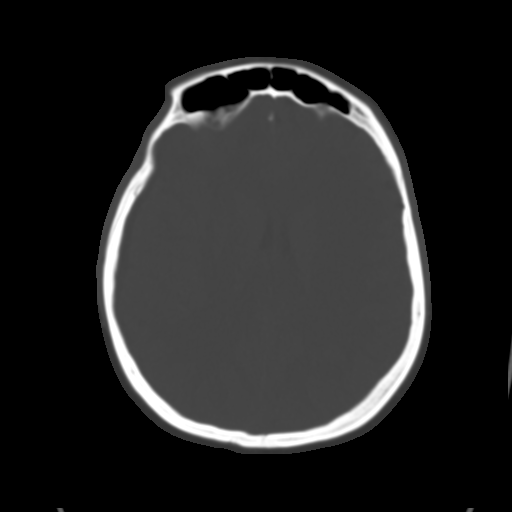
[im 23/31  brain]
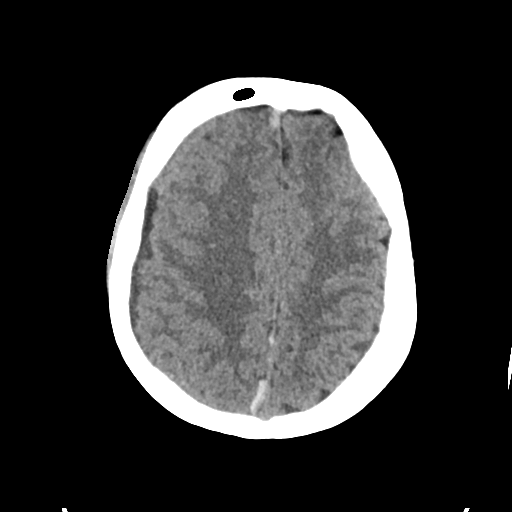
[im 27/31  brain]
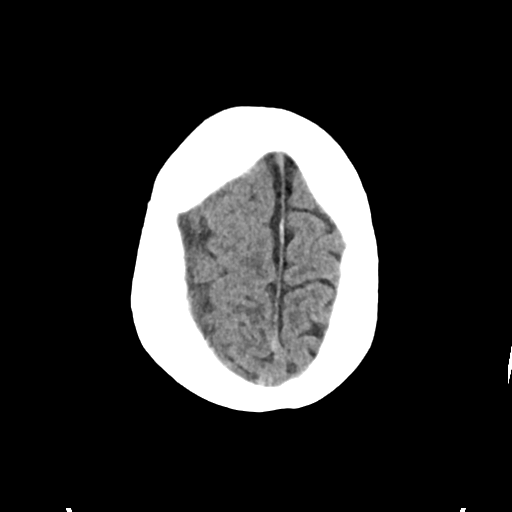

[Series 3: head bone · axial · 0.46mm/px · z∈[-84,-54]mm · 3 of 77 slices shown]
[im 8/77  bone]
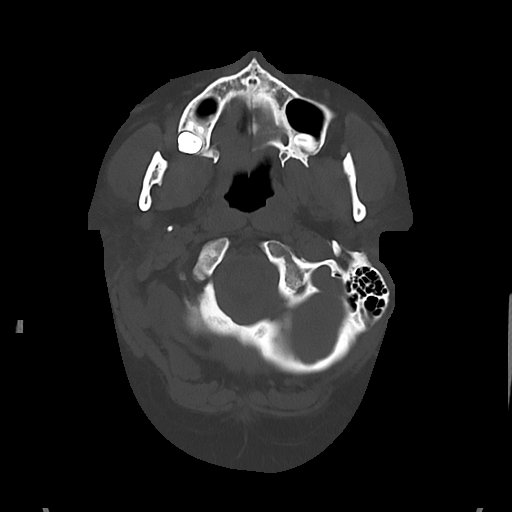
[im 16/77  bone]
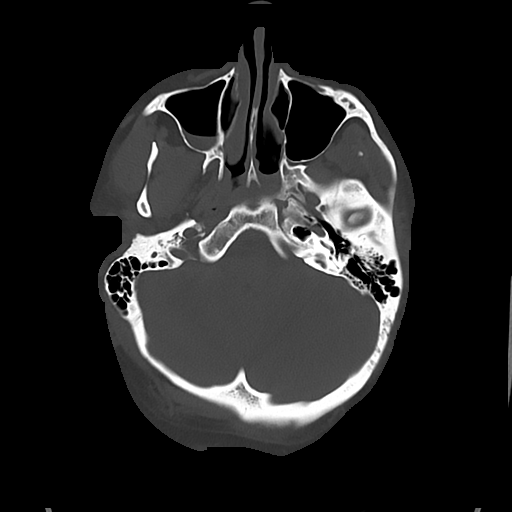
[im 23/77  bone]
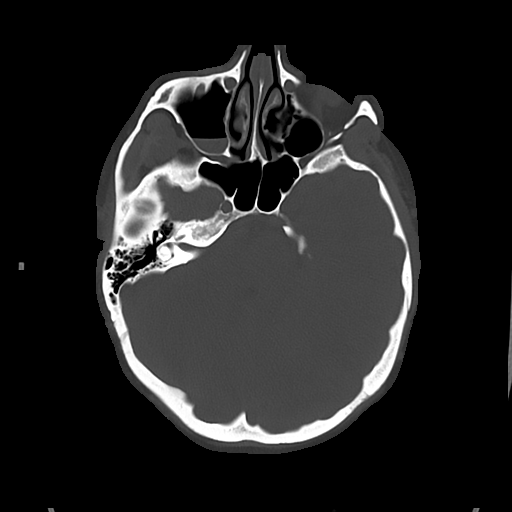

[Series 4: cor soft · coronal · 0.30mm/px · 3 of 73 slices shown]
[im 25/73  brain]
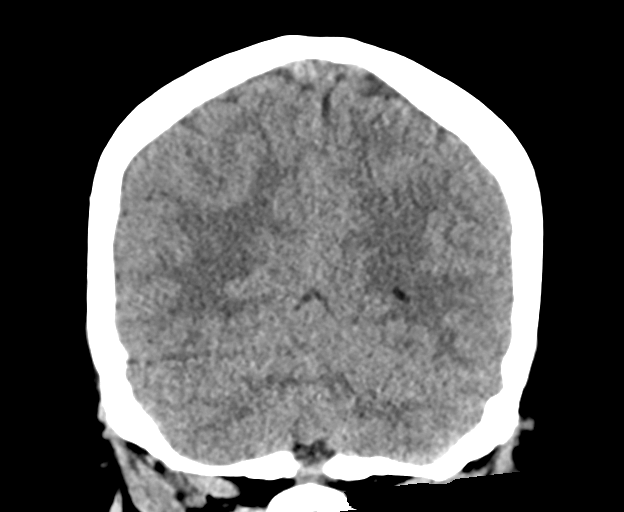
[im 33/73  brain]
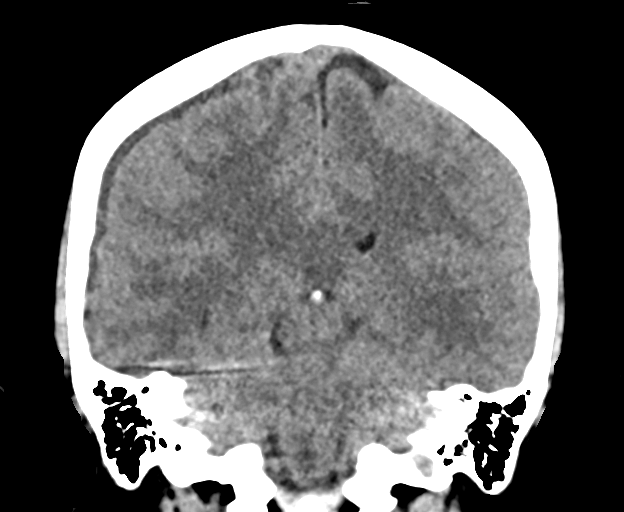
[im 41/73  brain]
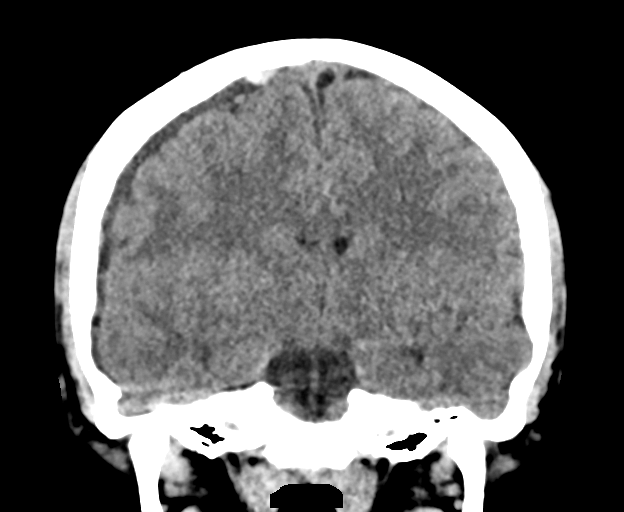

[Series 5: sag soft · sagittal · 0.33mm/px · 3 of 64 slices shown]
[im 22/64  brain]
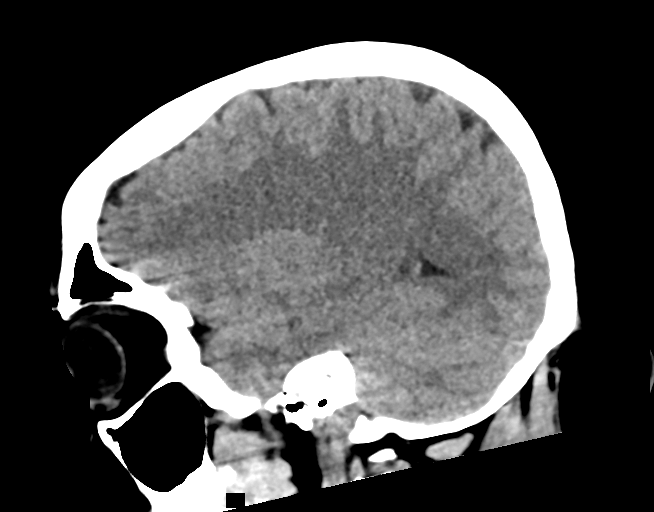
[im 32/64  brain]
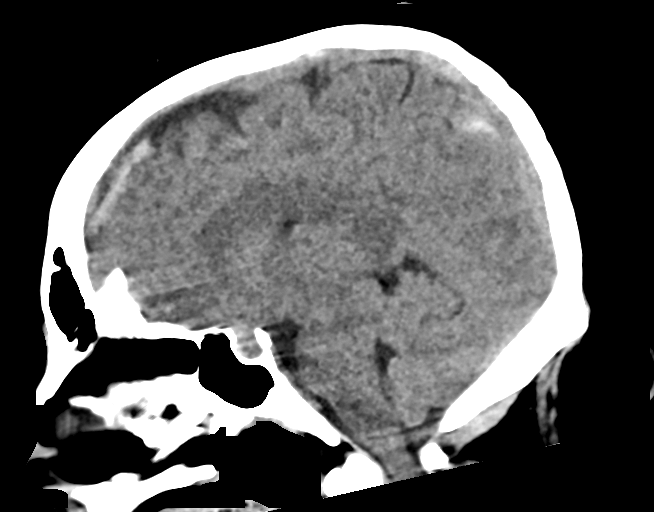
[im 42/64  brain]
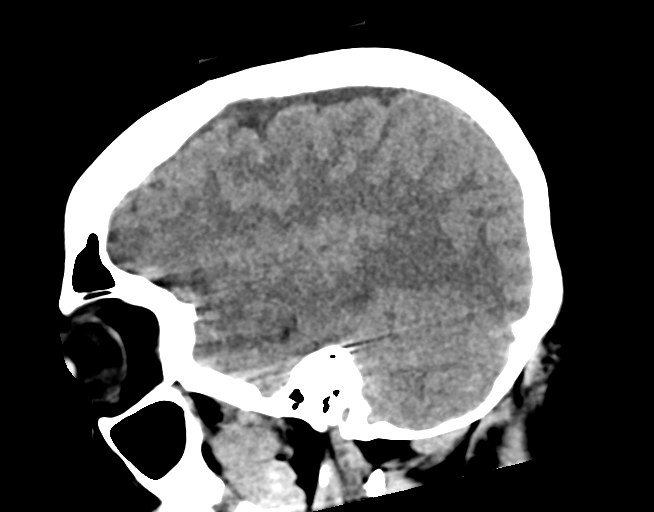

[16 of 47 positions shown; findings below may reference images not displayed]

FINDINGS: Brain: A small volume subdural hematoma extending diffusely over the
right cerebral convexity is unchanged in size and measures up to 5
mm in thickness. The collection contains hypodense fluid as well as
scattered foci of hyperdense blood products, and there is unchanged
extension into the interhemispheric fissure. 3 mm of leftward
midline shift is unchanged. There is no evidence of acute infarct,
mass, or hydrocephalus. Prominent sellar soft tissue corresponds to
mild pituitary gland enlargement as seen on MRI.

Vascular: No hyperdense vessel.

Skull: No fracture or focal osseous lesion.

Sinuses/Orbits: Unchanged small volume fluid in the right sphenoid
sinus. Clear mastoid air cells. Unremarkable orbits.

Other: None.
IMPRESSION: 1. Unchanged right cerebral convexity subdural hematoma and 3 mm of
leftward midline shift.
2. No evidence of new intracranial abnormality.
